# Patient Record
Sex: Female | Born: 1980 | Race: Black or African American | Hispanic: No | Marital: Married | Smoking: Former smoker
Health system: Southern US, Community
[De-identification: ages and names within clinical notes are randomized; demographics above are authoritative.]

## PROBLEM LIST (undated history)

## (undated) DIAGNOSIS — D219 Benign neoplasm of connective and other soft tissue, unspecified: Secondary | ICD-10-CM

## (undated) DIAGNOSIS — F419 Anxiety disorder, unspecified: Secondary | ICD-10-CM

## (undated) DIAGNOSIS — N946 Dysmenorrhea, unspecified: Secondary | ICD-10-CM

## (undated) DIAGNOSIS — R87610 Atypical squamous cells of undetermined significance on cytologic smear of cervix (ASC-US): Secondary | ICD-10-CM

## (undated) HISTORY — PX: DILATION AND CURETTAGE OF UTERUS: SHX78

## (undated) HISTORY — PX: KNEE ARTHROSCOPY W/ MENISCAL REPAIR: SHX1877

## (undated) HISTORY — DX: Anxiety disorder, unspecified: F41.9

## (undated) HISTORY — PX: HERNIA REPAIR: SHX51

## (undated) HISTORY — DX: Atypical squamous cells of undetermined significance on cytologic smear of cervix (ASC-US): R87.610

## (undated) HISTORY — DX: Dysmenorrhea, unspecified: N94.6

---

## 2004-08-14 ENCOUNTER — Ambulatory Visit: Payer: Self-pay | Admitting: General Surgery

## 2004-11-05 ENCOUNTER — Ambulatory Visit: Payer: Self-pay | Admitting: Gastroenterology

## 2005-12-22 ENCOUNTER — Emergency Department: Payer: Self-pay | Admitting: Emergency Medicine

## 2005-12-23 ENCOUNTER — Ambulatory Visit: Payer: Self-pay | Admitting: Emergency Medicine

## 2006-01-26 ENCOUNTER — Ambulatory Visit: Payer: Self-pay | Admitting: Obstetrics & Gynecology

## 2006-09-23 ENCOUNTER — Ambulatory Visit: Payer: Self-pay | Admitting: Obstetrics and Gynecology

## 2007-02-16 ENCOUNTER — Emergency Department: Payer: Self-pay | Admitting: Emergency Medicine

## 2007-08-30 ENCOUNTER — Emergency Department: Payer: Self-pay | Admitting: Emergency Medicine

## 2009-05-15 ENCOUNTER — Emergency Department: Payer: Self-pay | Admitting: Emergency Medicine

## 2009-07-19 ENCOUNTER — Ambulatory Visit: Payer: Self-pay | Admitting: General Practice

## 2009-08-16 ENCOUNTER — Ambulatory Visit: Payer: Self-pay | Admitting: General Practice

## 2010-12-09 ENCOUNTER — Emergency Department: Payer: Self-pay | Admitting: Emergency Medicine

## 2013-03-19 ENCOUNTER — Emergency Department: Payer: Self-pay | Admitting: Emergency Medicine

## 2013-11-14 ENCOUNTER — Ambulatory Visit: Payer: Self-pay | Admitting: General Practice

## 2013-12-12 ENCOUNTER — Ambulatory Visit: Payer: Self-pay | Admitting: General Practice

## 2014-01-11 ENCOUNTER — Ambulatory Visit: Payer: Self-pay | Admitting: General Practice

## 2014-11-18 ENCOUNTER — Encounter: Payer: Self-pay | Admitting: Emergency Medicine

## 2014-11-18 ENCOUNTER — Emergency Department
Admission: EM | Admit: 2014-11-18 | Discharge: 2014-11-18 | Disposition: A | Discharge status: Home / Self Care | Payer: BLUE CROSS/BLUE SHIELD | Attending: Emergency Medicine | Admitting: Emergency Medicine

## 2014-11-18 ENCOUNTER — Emergency Department: Payer: BLUE CROSS/BLUE SHIELD

## 2014-11-18 DIAGNOSIS — O200 Threatened abortion: Secondary | ICD-10-CM | POA: Diagnosis not present

## 2014-11-18 DIAGNOSIS — Z87891 Personal history of nicotine dependence: Secondary | ICD-10-CM | POA: Diagnosis not present

## 2014-11-18 DIAGNOSIS — Z3A01 Less than 8 weeks gestation of pregnancy: Secondary | ICD-10-CM | POA: Insufficient documentation

## 2014-11-18 DIAGNOSIS — O209 Hemorrhage in early pregnancy, unspecified: Secondary | ICD-10-CM | POA: Diagnosis present

## 2014-11-18 LAB — URINALYSIS COMPLETE WITH MICROSCOPIC (ARMC ONLY)
BACTERIA UA: NONE SEEN
Bilirubin Urine: NEGATIVE
Glucose, UA: NEGATIVE mg/dL
Ketones, ur: NEGATIVE mg/dL
Leukocytes, UA: NEGATIVE
NITRITE: NEGATIVE
PH: 7 (ref 5.0–8.0)
Protein, ur: 30 mg/dL — AB
Specific Gravity, Urine: 1.02 (ref 1.005–1.030)

## 2014-11-18 LAB — HCG, QUANTITATIVE, PREGNANCY: hCG, Beta Chain, Quant, S: 250 m[IU]/mL — ABNORMAL HIGH (ref <5)

## 2014-11-18 LAB — CBC
HCT: 40.5 % (ref 35.0–47.0)
Hemoglobin: 13.7 g/dL (ref 12.0–16.0)
MCH: 30 pg (ref 26.0–34.0)
MCHC: 33.9 g/dL (ref 32.0–36.0)
MCV: 88.6 fL (ref 80.0–100.0)
PLATELETS: 242 10*3/uL (ref 150–440)
RBC: 4.57 MIL/uL (ref 3.80–5.20)
RDW: 14.6 % — AB (ref 11.5–14.5)
WBC: 9.8 10*3/uL (ref 3.6–11.0)

## 2014-11-18 NOTE — ED Notes (Signed)
Patient to ED with report that she is about [redacted] weeks pregnant per 3 home pregnancy tests. Patient reports that for the last week she has significant bleeding and cramps along with passing some clots.

## 2014-11-18 NOTE — ED Provider Notes (Signed)
[facility] Emergency Department Provider Note  ____________________________________________  Time seen: 4:15 PM  I have reviewed the triage vital signs and the nursing notes.   HISTORY  Chief Complaint Threatened Miscarriage    HPI Alexis Cordova is a 34 y.o. female who complains of pelvic pain and cramping for the past week. She reports that she initially also had some vaginal spotting, which is now been worsening vaginal bleeding the last 5 days. She is having some clot passage as well. She denies any chest pain, shortness of breath, fever, chills, lightheadedness. She feels that she is about [redacted] weeks pregnant by LMP. 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 Denies any history of STI or PID. She has not had any prenatal care during this pregnancy yet, although she does have an upcoming appointment with Dr. Kenton Kingfisher with [facility] on Tuesday.She denies any dysuria or hematuria.  The pelvic pain is described as cramping, nonradiating, no alleviating or aggravating factors, intermittent, moderate intensity. Other than some occasional nausea, no associated symptoms.    History reviewed. No pertinent past medical history.  There are no active problems to display for this patient.   History reviewed. No pertinent past surgical history. Prior umbilical hernia repair No current outpatient prescriptions on file.  Allergies Shellfish allergy  History reviewed. No pertinent family history.  Social History History  Substance Use Topics  . Smoking status: Former Research scientist (life sciences)  . Smokeless tobacco: Never Used  . Alcohol Use: No    Review of Systems  Constitutional: No fever or chills. No weight changes Eyes:No blurry vision or double vision.  ENT: No sore throat. Cardiovascular: No chest pain. Respiratory: No dyspnea or cough. Gastrointestinal: No vomiting and diarrhea.  No BRBPR or melena. Genitourinary: Negative for dysuria, urinary  retention, bloody urine, or difficulty urinating. Musculoskeletal: Negative for back pain. No joint swelling or pain. Skin: Negative for rash. Neurological: Negative for headaches, focal weakness or numbness. Psychiatric:No anxiety or depression.   Endocrine:No hot/cold intolerance, changes in energy, or sleep difficulty.  10-point ROS otherwise negative.  ____________________________________________   PHYSICAL EXAM:  VITAL SIGNS: ED Triage Vitals  Enc Vitals Group     BP 11/18/14 1414 158/93 mmHg     Pulse Rate 11/18/14 1414 115     Resp 11/18/14 1414 20     Temp 11/18/14 1414 98.5 F (36.9 C)     Temp Source 11/18/14 1414 Oral     SpO2 11/18/14 1414 99 %     Weight 11/18/14 1414 180 lb (81.647 kg)     Height 11/18/14 1414 5\' 1"  (1.549 m)     Head Cir --      Peak Flow --      Pain Score 11/18/14 1414 7     Pain Loc --      Pain Edu? --      Excl. in [place]? --      Constitutional: Alert and oriented. Well appearing and in no distress. Eyes: No scleral icterus. No conjunctival pallor. PERRL. EOMI ENT   Head: Normocephalic and atraumatic.   Nose: No congestion/rhinnorhea. No septal hematoma   Mouth/Throat: MMM, no pharyngeal erythema   Neck: No stridor. No SubQ emphysema.  Hematological/Lymphatic/Immunilogical: No cervical lymphadenopathy. Cardiovascular: RRR. Normal and symmetric distal pulses are present in all extremities. No murmurs, rubs, or gallops. Respiratory: Normal respiratory effort without tachypnea nor retractions. Breath sounds are clear and equal bilaterally. No wheezes/rales/rhonchi. Gastrointestinal: Soft, mild suprapubic tenderness.. No distention. There  is no CVA tenderness.  No rebound, rigidity, or guarding. Genitourinary: deferred Musculoskeletal: Nontender with normal range of motion in all extremities. No joint effusions.  No lower extremity tenderness.  No edema. Neurologic:   Normal speech and language.  CN 2-10 normal. Motor  grossly intact. No pronator drift.  Normal gait. No gross focal neurologic deficits are appreciated.  Skin:  Skin is warm, dry and intact. No rash noted.  No petechiae, purpura, or bullae. Psychiatric: Mood and affect are normal. Speech and behavior are normal. Patient exhibits appropriate insight and judgment.  ____________________________________________    LABS (pertinent positives/negatives) Labs Reviewed  CBC - Abnormal; Notable for the following:    RDW 14.6 (*)    All other components within normal limits  HCG, QUANTITATIVE, PREGNANCY - Abnormal; Notable for the following:    hCG, Beta Chain, Quant, S 250 (*)    All other components within normal limits  URINALYSIS COMPLETEWITH MICROSCOPIC ([facility])  - Abnormal; Notable for the following:    Color, Urine YELLOW (*)    APPearance HAZY (*)    Hgb urine dipstick 3+ (*)    Protein, ur 30 (*)    Squamous Epithelial / LPF 0-5 (*)    All other components within normal limits  TYPE AND SCREEN  ABO/RH   ____________________________________________   EKG    ____________________________________________    RADIOLOGY  Pelvic ultrasound reveals an empty uterus and no visualized intra-or extrauterine pregnancy. There is a probable small leiomyoma.  ____________________________________________   PROCEDURES  ____________________________________________   INITIAL IMPRESSION / ASSESSMENT AND PLAN / ED COURSE  Pertinent labs & imaging results that were available during my care of the patient were reviewed by me and considered in my medical decision making (see chart for details).  The patient presents with a threatened miscarriage or possible ectopic pregnancy. She is currently hemodynamically stable, but with one week of worsening vaginal bleeding and tachycardia on presentation to the triage desk, we'll proceed with an ultrasound to evaluate for ectopic pregnancy and further characterize whether she has an IUP or if she is  possibly already had a miscarriage. The patient declines any pain medicine or antiemetics at this time. ----------------------------------------- 7:16 PM on 11/18/2014 -----------------------------------------  Ultrasound result obtained. Vital signs normal in the ED now. No clear ectopic or hemoperitoneum. The patient artery has an appointment with her obstetrician in 3 days, so she'll follow up for repeat blood testing at that time to monitor her hCG. Return precautions given patient understands. Medically stable at this time, to be discharged home. ____________________________________________   FINAL CLINICAL IMPRESSION(S) / ED DIAGNOSES  Final diagnoses:  Threatened miscarriage in early pregnancy      Carrie Mew, MD 11/18/14 [phone]

## 2014-11-18 NOTE — ED Notes (Signed)
Pt resting in bed, husband at bedside. 

## 2014-11-18 NOTE — ED Notes (Signed)
Patient transported to Ultrasound 

## 2014-11-18 NOTE — Discharge Instructions (Signed)
Vaginal Bleeding During Pregnancy, First Trimester A small amount of bleeding (spotting) from the vagina is relatively common in early pregnancy. It usually stops on its own. Various things may cause bleeding or spotting in early pregnancy. Some bleeding may be related to the pregnancy, and some may not. In most cases, the bleeding is normal and is not a problem. However, bleeding can also be a sign of something serious. Be sure to tell your health care provider about any vaginal bleeding right away. Some possible causes of vaginal bleeding during the first trimester include:  Infection or inflammation of the cervix.  Growths (polyps) on the cervix.  Miscarriage or threatened miscarriage.  Pregnancy tissue has developed outside of the uterus and in a fallopian tube (tubal pregnancy).  Tiny cysts have developed in the uterus instead of pregnancy tissue (molar pregnancy). HOME CARE INSTRUCTIONS  Watch your condition for any changes. The following actions may help to lessen any discomfort you are feeling:  Follow your health care provider's instructions for limiting your activity. If your health care provider orders bed rest, you may need to stay in bed and only get up to use the bathroom. However, your health care provider may allow you to continue light activity.  If needed, make plans for someone to help with your regular activities and responsibilities while you are on bed rest.  Keep track of the number of pads you use each day, how often you change pads, and how soaked (saturated) they are. Write this down.  Do not use tampons. Do not douche.  Do not have sexual intercourse or orgasms until approved by your health care provider.  If you pass any tissue from your vagina, save the tissue so you can show it to your health care provider.  Only take over-the-counter or prescription medicines as directed by your health care provider.  Do not take aspirin because it can make you  bleed.  Keep all follow-up appointments as directed by your health care provider. SEEK MEDICAL CARE IF:  You have any vaginal bleeding during any part of your pregnancy.  You have cramps or labor pains.  You have a fever, not controlled by medicine. SEEK IMMEDIATE MEDICAL CARE IF:   You have severe cramps in your back or belly (abdomen).  You pass large clots or tissue from your vagina.  Your bleeding increases.  You feel light-headed or weak, or you have fainting episodes.  You have chills.  You are leaking fluid or have a gush of fluid from your vagina.  You pass out while having a bowel movement. MAKE SURE YOU:  Understand these instructions.  Will watch your condition.  Will get help right away if you are not doing well or get worse. Document Released: 04/09/2005 Document Revised: 07/05/2013 Document Reviewed: 03/07/2013 [facility] Patient Information 2015 [place], [place]. This information is not intended to replace advice given to you by your health care provider. Make sure you discuss any questions you have with your health care provider.  Your ultrasound was unable to find a pregnancy in the uterus or anywhere else at this time. This may be because it is too early, because you have already had a miscarriage, or because there is an ectopic (abnormally located) pregnancy. Follow-up with Dr. Kenton Kingfisher at your appointment in 3 days for repeat blood testing.

## 2014-11-19 LAB — TYPE AND SCREEN
ABO/RH(D): O POS
Antibody Screen: NEGATIVE

## 2014-11-19 LAB — ABO/RH: ABO/RH(D): O POS

## 2014-12-08 ENCOUNTER — Other Ambulatory Visit
Admission: RE | Admit: 2014-12-08 | Discharge: 2014-12-08 | Disposition: A | Discharge status: Home / Self Care | Payer: BLUE CROSS/BLUE SHIELD | Source: Ambulatory Visit | Attending: Obstetrics & Gynecology | Admitting: Obstetrics & Gynecology

## 2014-12-08 DIAGNOSIS — O009 Ectopic pregnancy, unspecified: Secondary | ICD-10-CM | POA: Insufficient documentation

## 2014-12-08 LAB — HCG, QUANTITATIVE, PREGNANCY: hCG, Beta Chain, Quant, S: 610 m[IU]/mL — ABNORMAL HIGH (ref <5)

## 2014-12-11 ENCOUNTER — Other Ambulatory Visit
Admission: RE | Admit: 2014-12-11 | Discharge: 2014-12-11 | Disposition: A | Discharge status: Home / Self Care | Payer: BLUE CROSS/BLUE SHIELD | Source: Ambulatory Visit | Attending: Obstetrics & Gynecology | Admitting: Obstetrics & Gynecology

## 2014-12-11 DIAGNOSIS — O009 Ectopic pregnancy, unspecified: Secondary | ICD-10-CM | POA: Insufficient documentation

## 2014-12-11 LAB — HCG, QUANTITATIVE, PREGNANCY: HCG, BETA CHAIN, QUANT, S: 437 m[IU]/mL — AB (ref <5)

## 2015-11-01 DIAGNOSIS — T781XXD Other adverse food reactions, not elsewhere classified, subsequent encounter: Secondary | ICD-10-CM | POA: Diagnosis not present

## 2016-09-02 DIAGNOSIS — Z01419 Encounter for gynecological examination (general) (routine) without abnormal findings: Secondary | ICD-10-CM | POA: Diagnosis not present

## 2016-12-04 DIAGNOSIS — H52213 Irregular astigmatism, bilateral: Secondary | ICD-10-CM | POA: Diagnosis not present

## 2017-01-21 ENCOUNTER — Telehealth: Payer: Self-pay

## 2017-01-21 NOTE — Telephone Encounter (Signed)
Do you want her to come in  or go to ER?

## 2017-01-21 NOTE — Telephone Encounter (Signed)
Appt Thurs

## 2017-01-21 NOTE — Telephone Encounter (Signed)
Pt thinks she has had another miscarriage.  Wants to know if it's best if she come or wait?  Please call.  [phone]

## 2017-01-22 ENCOUNTER — Encounter: Payer: Self-pay | Admitting: Obstetrics & Gynecology

## 2017-01-27 ENCOUNTER — Ambulatory Visit (INDEPENDENT_AMBULATORY_CARE_PROVIDER_SITE_OTHER): Payer: BLUE CROSS/BLUE SHIELD | Admitting: Obstetrics & Gynecology

## 2017-01-27 ENCOUNTER — Encounter: Payer: Self-pay | Admitting: Obstetrics & Gynecology

## 2017-01-27 VITALS — BP 120/80 | Ht 61.0 in | Wt 173.0 lb

## 2017-01-27 DIAGNOSIS — O209 Hemorrhage in early pregnancy, unspecified: Secondary | ICD-10-CM | POA: Insufficient documentation

## 2017-01-27 NOTE — Progress Notes (Signed)
Obstetric Problem Visit   Chief Complaint:  Chief Complaint  Patient presents with  . Threatened Miscarriage    History of Present Illness: Patient is a 36 y.o. [id number] w LMP 12/12/16 for first trimester bleeding.  The onset of bleeding was last week for 4 days now gone.  Had breast T now resolved.  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    Is bleeding equal to or greater than normal menstrual flow:  No Any recent trauma:  No Recent intercourse:  No History of prior miscarriage:  Yes Prior ultrasound demonstrating IUP:  No Prior ultrasound demonstrating viable IUP:  No Prior Serum HCG:  No Rh status: O+  PMHx: She  has no past medical history on file. Also,  has no past surgical history on file., family history is not on file.,  reports that she has quit smoking. She has never used smokeless tobacco. She reports that she does not drink alcohol or use drugs.  She currently has no medications in their medication list. Also, is allergic to shellfish allergy.  Review of Systems  Constitutional: Negative for chills, fever and malaise/fatigue.  HENT: Negative for congestion, sinus pain and sore throat.   Eyes: Negative for blurred vision and pain.  Respiratory: Negative for cough and wheezing.   Cardiovascular: Negative for chest pain and leg swelling.  Gastrointestinal: Negative for abdominal pain, constipation, diarrhea, heartburn, nausea and vomiting.  Genitourinary: Negative for dysuria, frequency, hematuria and urgency.  Musculoskeletal: Negative for back pain, joint pain, myalgias and neck pain.  Skin: Negative for itching and rash.  Neurological: Negative for dizziness, tremors and weakness.  Endo/Heme/Allergies: Does not bruise/bleed easily.  Psychiatric/Behavioral: Negative for depression. The patient is not nervous/anxious and does not have insomnia.     Objective: Vitals:   01/27/17 0900  BP:  120/80   Physical Exam  Constitutional: She is oriented to person, place, and time. She appears well-developed and well-nourished. No distress.  Musculoskeletal: Normal range of motion.  Neurological: She is alert and oriented to person, place, and time.  Skin: Skin is warm and dry.  Psychiatric: She has a normal mood and affect.  Vitals reviewed.   Assessment: 36 y.o. [id number] Unknown 1. First trimester bleeding - Beta HCG, Quant   Plan: Problem List Items Addressed This Visit      Other   First trimester bleeding - Primary   Relevant Orders   Beta HCG, Quant     1) First trimester bleeding - incidence and clinical course of first trimester bleeding is discussed in detail with the patient today.  Approximately 1/3 of pregnancies ending in live births experienced 1st trimester bleeding.  The amount of bleeding is variable and not necessarily predictive of outcome.  Sources may be cervical or uterine.  Subchorionic hemorrhages are a frequent concurrent findings on ultrasound and are followed expectantly.  These often absorb or regress spontaneously although risk for expansion and further disruption of the utero-placental interface leading to miscarriage is possible.  There is no clearly documented benefit to limiting or modifying activity and sexual intercourse in altering clinic course of 1st trimester bleeding.    2) Trend HCG levels.  This follows similar miscarriage pattern for pt.  3) The patient is Rh +, rhogam is therefore not indicated to decrease the risk rhesus alloimmunization.    4) Routine bleeding precautions were discussed with the patient prior the conclusion of today's  visit.  5) Rec REI referral for optimal conception planning as she needs support due to recurrent history.  Barnett Applebaum, MD, [facility], [facility] 01/27/2017  9:19 AM

## 2017-01-28 LAB — BETA HCG QUANT (REF LAB): hCG Quant: 4 m[IU]/mL

## 2017-10-15 ENCOUNTER — Ambulatory Visit: Payer: BLUE CROSS/BLUE SHIELD | Admitting: Obstetrics & Gynecology

## 2017-11-09 ENCOUNTER — Ambulatory Visit (INDEPENDENT_AMBULATORY_CARE_PROVIDER_SITE_OTHER): Payer: BLUE CROSS/BLUE SHIELD | Admitting: Obstetrics & Gynecology

## 2017-11-09 ENCOUNTER — Encounter: Payer: Self-pay | Admitting: Obstetrics & Gynecology

## 2017-11-09 VITALS — BP 130/80 | Ht 61.0 in | Wt 177.0 lb

## 2017-11-09 DIAGNOSIS — Z0000 Encounter for general adult medical examination without abnormal findings: Secondary | ICD-10-CM

## 2017-11-09 DIAGNOSIS — E669 Obesity, unspecified: Secondary | ICD-10-CM | POA: Insufficient documentation

## 2017-11-09 NOTE — Progress Notes (Signed)
HPI:      Alexis Cordova is a 37 y.o. [id number] who LMP was Patient's last menstrual period was 11/03/2017., she presents today for her annual examination. The patient has no complaints today. The patient is sexually active. Her last pap: approximate date 2018 and was normal. The patient does perform self breast exams.  There is notable family history of breast or ovarian cancer in her family.  The patient has regular exercise: yes.  The patient denies current symptoms of depression.    GYN History: Contraception: none  PMHx: Past Medical History:  Diagnosis Date  . Dysmenorrhea   . Pap smear abnormality of cervix with ASCUS favoring benign    Past Surgical History:  Procedure Laterality Date  . HERNIA REPAIR     History reviewed. No pertinent family history. Social History   Tobacco Use  . Smoking status: Former Research scientist (life sciences)  . Smokeless tobacco: Never Used  Substance Use Topics  . Alcohol use: No  . Drug use: No   No current outpatient medications on file. Allergies: Shellfish allergy  Review of Systems  Constitutional: Negative for chills, fever and malaise/fatigue.  HENT: Negative for congestion, sinus pain and sore throat.   Eyes: Negative for blurred vision and pain.  Respiratory: Negative for cough and wheezing.   Cardiovascular: Negative for chest pain and leg swelling.  Gastrointestinal: Negative for abdominal pain, constipation, diarrhea, heartburn, nausea and vomiting.  Genitourinary: Negative for dysuria, frequency, hematuria and urgency.  Musculoskeletal: Negative for back pain, joint pain, myalgias and neck pain.  Skin: Negative for itching and rash.  Neurological: Negative for dizziness, tremors and weakness.  Endo/Heme/Allergies: Does not bruise/bleed easily.  Psychiatric/Behavioral: Negative for depression. The patient is not nervous/anxious and does not have insomnia.     Objective: BP 130/80   Ht 5\' 1"  (1.549 m)   Wt 177 lb (80.3 kg)   LMP  11/03/2017   BMI 33.44 kg/m   Filed Weights   11/09/17 1427  Weight: 177 lb (80.3 kg)   Body mass index is 33.44 kg/m. Physical Exam  Constitutional: She is oriented to person, place, and time. She appears well-developed and well-nourished. No distress.  Genitourinary: Rectum normal, vagina normal and uterus normal. Pelvic exam was performed with patient supine. There is no rash or lesion on the right labia. There is no rash or lesion on the left labia. Vagina exhibits no lesion. No bleeding in the vagina. Right adnexum does not display mass and does not display tenderness. Left adnexum does not display mass and does not display tenderness. Cervix does not exhibit motion tenderness, lesion, friability or polyp.   Uterus is mobile and midaxial. Uterus is not enlarged or exhibiting a mass.  HENT:  Head: Normocephalic and atraumatic. Head is without laceration.  Right Ear: Hearing normal.  Left Ear: Hearing normal.  Nose: No epistaxis.  No foreign bodies.  Mouth/Throat: Uvula is midline, oropharynx is clear and moist and mucous membranes are normal.  Eyes: Pupils are equal, round, and reactive to light.  Neck: Normal range of motion. Neck supple. No thyromegaly present.  Cardiovascular: Normal rate and regular rhythm. Exam reveals no gallop and no friction rub.  No murmur heard. Pulmonary/Chest: Effort normal and breath sounds normal. No respiratory distress. She has no wheezes. Right breast exhibits no mass, no skin change and no tenderness. Left breast exhibits no mass, no skin change and no tenderness.  Abdominal: Soft. Bowel sounds are normal. She exhibits no distension. There is no  tenderness. There is no rebound.  Musculoskeletal: Normal range of motion.  Neurological: She is alert and oriented to person, place, and time. No cranial nerve deficit.  Skin: Skin is warm and dry.  Psychiatric: She has a normal mood and affect. Judgment normal.  Vitals reviewed.  Assessment:  ANNUAL  EXAM 1. Annual physical exam   2. Obesity (BMI 30-39.9)    Screening Plan:            1.  Cervical Screening-  Pap smear schedule reviewed with patient  2. Breast screening- Exam annually and mammogram>40 planned. Genetic testing discussed as option.  3. Colonoscopy every 10 years, Hemoccult testing - after age 1  4. Labs managed by PCP  5. Counseling for contraception: no method, h/o recurrent preg loss; considering REI referral  6. Obesity (BMI 30-39.9) Weight loss meds in past no help Encouraged diet, exercise    F/U  Return in about 1 year (around 11/10/2018) for Annual.  Barnett Applebaum, MD, [facility], [facility] 11/09/2017  3:00 PM

## 2018-02-25 ENCOUNTER — Ambulatory Visit (INDEPENDENT_AMBULATORY_CARE_PROVIDER_SITE_OTHER): Payer: BLUE CROSS/BLUE SHIELD | Admitting: Obstetrics and Gynecology

## 2018-02-25 ENCOUNTER — Other Ambulatory Visit (HOSPITAL_COMMUNITY)
Admission: RE | Admit: 2018-02-25 | Discharge: 2018-02-25 | Disposition: A | Discharge status: Home / Self Care | Payer: BLUE CROSS/BLUE SHIELD | Source: Ambulatory Visit | Attending: Obstetrics and Gynecology | Admitting: Obstetrics and Gynecology

## 2018-02-25 ENCOUNTER — Encounter: Payer: Self-pay | Admitting: Obstetrics and Gynecology

## 2018-02-25 VITALS — BP 128/76 | HR 87 | Wt 181.0 lb

## 2018-02-25 DIAGNOSIS — Z3A11 11 weeks gestation of pregnancy: Secondary | ICD-10-CM

## 2018-02-25 DIAGNOSIS — O418X10 Other specified disorders of amniotic fluid and membranes, first trimester, not applicable or unspecified: Secondary | ICD-10-CM

## 2018-02-25 DIAGNOSIS — Z3481 Encounter for supervision of other normal pregnancy, first trimester: Secondary | ICD-10-CM | POA: Diagnosis not present

## 2018-02-25 DIAGNOSIS — Z113 Encounter for screening for infections with a predominantly sexual mode of transmission: Secondary | ICD-10-CM | POA: Diagnosis not present

## 2018-02-25 DIAGNOSIS — IMO0001 Reserved for inherently not codable concepts without codable children: Secondary | ICD-10-CM

## 2018-02-25 DIAGNOSIS — O09291 Supervision of pregnancy with other poor reproductive or obstetric history, first trimester: Secondary | ICD-10-CM

## 2018-02-25 DIAGNOSIS — Z130 Encounter for screening for diseases of the blood and blood-forming organs and certain disorders involving the immune mechanism: Secondary | ICD-10-CM

## 2018-02-25 DIAGNOSIS — O418X20 Other specified disorders of amniotic fluid and membranes, second trimester, not applicable or unspecified: Secondary | ICD-10-CM

## 2018-02-25 DIAGNOSIS — O468X1 Other antepartum hemorrhage, first trimester: Secondary | ICD-10-CM

## 2018-02-25 DIAGNOSIS — O468X2 Other antepartum hemorrhage, second trimester: Secondary | ICD-10-CM

## 2018-02-25 NOTE — Progress Notes (Signed)
NOB Spotting only toilet tissue/no cramping

## 2018-02-25 NOTE — Progress Notes (Signed)
02/25/2018   Chief Complaint: Missed period  Transfer of Care Patient: no  History of Present Illness: Alexis Cordova is a 37 y.o. G7P0060 [redacted]w[redacted]d based on Patient's last menstrual period was 12/07/2017 (exact date). with an Estimated Date of Delivery: 09/13/18, with the above CC.   Her periods were: regular periods every 30 days She was using no method when she conceived.  She has Negative signs or symptoms of nausea/vomiting of pregnancy. She has Negative signs or symptoms of miscarriage or preterm labor She identifies Negative Zika risk factors for her and her partner On any different medications around the time she conceived/early pregnancy: No  History of varicella: Yes   ROS: A 12-point review of systems was performed and negative, except as stated in the above HPI.  OBGYN History: As per HPI. OB History  Gravida Para Term Preterm AB Living  7 0 0 0 6 0  SAB TAB Ectopic Multiple Live Births  5 1          # Outcome Date GA Lbr Len/2nd Weight Sex Delivery Anes PTL Lv  7 Current           6 TAB           5 SAB           4 SAB           3 SAB           2 SAB           1 SAB             Any issues with any prior pregnancies: yes, multiple miscarriages (5) Any prior children are healthy, doing well, without any problems or issues: not applicable History of pap smears: Yes. Last pap smear 2018. Abnormal: no   History of STIs: No   Past Medical History: Past Medical History:  Diagnosis Date  . Anxiety   . Dysmenorrhea   . Pap smear abnormality of cervix with ASCUS favoring benign     Past Surgical History: Past Surgical History:  Procedure Laterality Date  . DILATION AND CURETTAGE OF UTERUS    . HERNIA REPAIR     Umbilical hernia    Family History:  History reviewed. No pertinent family history. She denies any female cancers, bleeding or blood clotting disorders.  She denies any history of mental retardation, birth defects or genetic disorders in her or the FOB's  history  Social History:  Social History   Socioeconomic History  . Marital status: Married    Spouse name: Not on file  . Number of children: Not on file  . Years of education: Not on file  . Highest education level: Not on file  Occupational History  . Not on file  Social Needs  . Financial resource strain: Not on file  . Food insecurity:    Worry: Not on file    Inability: Not on file  . Transportation needs:    Medical: Not on file    Non-medical: Not on file  Tobacco Use  . Smoking status: Former Research scientist (life sciences)  . Smokeless tobacco: Never Used  Substance and Sexual Activity  . Alcohol use: No  . Drug use: No  . Sexual activity: Yes    Birth control/protection: None  Lifestyle  . Physical activity:    Days per week: Not on file    Minutes per session: Not on file  . Stress: Not on file  Relationships  . Social connections:  Talks on phone: Not on file    Gets together: Not on file    Attends religious service: Not on file    Active member of club or organization: Not on file    Attends meetings of clubs or organizations: Not on file    Relationship status: Not on file  . Intimate partner violence:    Fear of current or ex partner: Not on file    Emotionally abused: Not on file    Physically abused: Not on file    Forced sexual activity: Not on file  Other Topics Concern  . Not on file  Social History Narrative  . Not on file   Any pets in the household: yes Dogs  Allergy: Allergies  Allergen Reactions  . Shellfish Allergy     Just sick     Current Outpatient Medications: No current outpatient medications on file.   Physical Exam:   BP 128/76   Pulse 87   Wt 181 lb (82.1 kg)   LMP 12/07/2017 (Exact Date)   BMI 34.20 kg/m  Body mass index is 34.2 kg/m. Constitutional: Well nourished, well developed female in no acute distress.  Neck:  Supple, normal appearance, and no thyromegaly  Cardiovascular: S1, S2 normal, no murmur, rub or gallop,  regular rate and rhythm Respiratory:  Clear to auscultation bilateral. Normal respiratory effort Abdomen: positive bowel sounds and no masses, hernias; diffusely non tender to palpation, non distended Breasts: breasts appear normal, no suspicious masses, no skin or nipple changes or axillary nodes. Neuro/Psych:  Normal mood and affect.  Skin:  Warm and dry.  Lymphatic:  No inguinal lymphadenopathy.   Pelvic exam: is limited by body habitus EGBUS: within normal limits, Vagina: within normal limits and with no blood in the vault, Cervix: normal appearing cervix without discharge or lesions, closed/long/high, Uterus:  enlarged: 12-14 week size, and Adnexa:  normal adnexa  Bedside US showed a viable infant with FHR 170 bpm. Appeared to be 12-[redacted] weeks gestation but grown rump length was not able to be performed because of fetal position.   Assessment: Alexis Cordova is a 37 y.o. G7P0060 [redacted]w[redacted]d based on Patient's last menstrual period was 12/07/2017 (exact date). with an Estimated Date of Delivery: 09/13/18,  for prenatal care.  Plan:  1) Avoid alcoholic beverages. 2) Patient encouraged not to smoke.  3) Discontinue the use of all non-medicinal drugs and chemicals.  4) Take prenatal vitamins daily.  5) Seatbelt use advised 6) Nutrition, food safety (fish, cheese advisories, and high nitrite foods) and exercise discussed. 7) Hospital and practice style delivering at [facility] discussed  8) Patient is asked about travel to areas at risk for the [place] virus, and counseled to avoid travel and exposure to mosquitoes or sexual partners who may have themselves been exposed to the virus. Testing is discussed, and will be ordered as appropriate.  9) Childbirth classes at [facility] advised 10) Genetic Screening, such as with 1st Trimester Screening, cell free fetal DNA, AFP testing, and Ultrasound, as well as with amniocentesis and [facility] as appropriate, is discussed with patient. She plans to have genetic testing this  pregnancy.  Problem list reviewed and updated.  Adrian Prows MD [facility], [facility] 02/25/18 5:12 PM

## 2018-02-26 ENCOUNTER — Telehealth: Payer: Self-pay

## 2018-02-26 ENCOUNTER — Ambulatory Visit (INDEPENDENT_AMBULATORY_CARE_PROVIDER_SITE_OTHER): Payer: BLUE CROSS/BLUE SHIELD | Admitting: Obstetrics and Gynecology

## 2018-02-26 VITALS — BP 130/80 | Wt 181.0 lb

## 2018-02-26 DIAGNOSIS — O200 Threatened abortion: Secondary | ICD-10-CM

## 2018-02-26 LAB — RPR+RH+ABO+RUB AB+AB SCR+CB...
Antibody Screen: NEGATIVE
HIV Screen 4th Generation wRfx: NONREACTIVE
Hematocrit: 39.6 % (ref 34.0–46.6)
Hemoglobin: 13.8 g/dL (ref 11.1–15.9)
Hepatitis B Surface Ag: NEGATIVE
MCH: 29.9 pg (ref 26.6–33.0)
MCHC: 34.8 g/dL (ref 31.5–35.7)
MCV: 86 fL (ref 79–97)
PLATELETS: 285 10*3/uL (ref 150–450)
RBC: 4.61 x10E6/uL (ref 3.77–5.28)
RDW: 15.2 % (ref 12.3–15.4)
RPR Ser Ql: NONREACTIVE
Rh Factor: POSITIVE
Rubella Antibodies, IGG: 2.79 index (ref >0.99)
Varicella zoster IgG: 1903 index (ref >165)
WBC: 8.5 10*3/uL (ref 3.4–10.8)

## 2018-02-26 LAB — URINE DRUG PANEL 7
AMPHETAMINES, URINE: NEGATIVE ng/mL
BARBITURATE QUANT UR: NEGATIVE ng/mL
BENZODIAZEPINE QUANT UR: NEGATIVE ng/mL
CANNABINOID QUANT UR: NEGATIVE ng/mL
Cocaine (Metab.): NEGATIVE ng/mL
Opiate Quant, Ur: NEGATIVE ng/mL
PCP QUANT UR: NEGATIVE ng/mL

## 2018-02-26 LAB — CERVICOVAGINAL ANCILLARY ONLY
Chlamydia: NEGATIVE
Neisseria Gonorrhea: NEGATIVE
Trichomonas: NEGATIVE

## 2018-02-26 NOTE — Progress Notes (Signed)
C/o Vaginal bleeding starting last night at 9pm, no pain, many clots.

## 2018-02-26 NOTE — Telephone Encounter (Addendum)
Pt reported to after hours triage she was just seen for NOB & is bleeding passing lots of tissue, but not in pain. She is [redacted] wks pregnant. Triage advised her to contact PCP within 24 hours.

## 2018-02-26 NOTE — Telephone Encounter (Signed)
Well double book her and let her come in

## 2018-02-27 LAB — URINE CULTURE

## 2018-03-05 ENCOUNTER — Ambulatory Visit (INDEPENDENT_AMBULATORY_CARE_PROVIDER_SITE_OTHER): Payer: BLUE CROSS/BLUE SHIELD | Admitting: Obstetrics & Gynecology

## 2018-03-05 ENCOUNTER — Ambulatory Visit (INDEPENDENT_AMBULATORY_CARE_PROVIDER_SITE_OTHER): Payer: BLUE CROSS/BLUE SHIELD

## 2018-03-05 VITALS — BP 120/80 | Wt 181.0 lb

## 2018-03-05 DIAGNOSIS — Z3481 Encounter for supervision of other normal pregnancy, first trimester: Secondary | ICD-10-CM

## 2018-03-05 DIAGNOSIS — Z3A12 12 weeks gestation of pregnancy: Secondary | ICD-10-CM

## 2018-03-05 DIAGNOSIS — O09522 Supervision of elderly multigravida, second trimester: Secondary | ICD-10-CM | POA: Diagnosis not present

## 2018-03-05 DIAGNOSIS — D252 Subserosal leiomyoma of uterus: Secondary | ICD-10-CM

## 2018-03-05 DIAGNOSIS — O3413 Maternal care for benign tumor of corpus uteri, third trimester: Secondary | ICD-10-CM

## 2018-03-05 DIAGNOSIS — D251 Intramural leiomyoma of uterus: Secondary | ICD-10-CM

## 2018-03-05 DIAGNOSIS — D219 Benign neoplasm of connective and other soft tissue, unspecified: Secondary | ICD-10-CM

## 2018-03-05 DIAGNOSIS — O3411 Maternal care for benign tumor of corpus uteri, first trimester: Secondary | ICD-10-CM

## 2018-03-05 LAB — POCT URINALYSIS DIPSTICK OB
Glucose, UA: NEGATIVE — AB
POC,PROTEIN,UA: NEGATIVE

## 2018-03-05 NOTE — Progress Notes (Signed)
  HPI: Pt has h/o recurrent preg loss, now pregnant [redacted] weeks.  Post coital spotting, so has stopped having sex.  No pain.  Ultrasound demonstrates 4 fibroids seen, one posterior intramural CRL c/w 12 weeks pregnancy, good FHT  PMHx: She  has a past medical history of Anxiety, Dysmenorrhea, and Pap smear abnormality of cervix with ASCUS favoring benign. Also,  has a past surgical history that includes Hernia repair and Dilation and curettage of uterus., family history is not on file.,  reports that she has quit smoking. She has never used smokeless tobacco. She reports that she does not drink alcohol or use drugs.  She currently has no medications in their medication list. Also, is allergic to shellfish allergy.  ROS- all neg except as above  Objective: BP 120/80   Wt 181 lb (82.1 kg)   LMP 12/07/2017 (Exact Date)   BMI 34.20 kg/m   Physical examination Constitutional NAD, Conversant  Skin No rashes, lesions or ulceration.   Extremities: Moves all appropriately.  Normal ROM for age. No lymphadenopathy.  Neuro: Grossly intact  Psych: Oriented to PPT.  Normal mood. Normal affect.   Assessment:  [redacted] weeks gestation of pregnancy - Plan: Hemoglobinopathy evaluation, Genetic labs  Fibroids - Plan: AMB referral to maternal fetal medicine    Counseled as to risks on pregnancy, miscarriage and PTL risks discussed    Baby ASA advised    Cont PNV  Multigravida of advanced maternal age in second trimester - Plan: MaterniT21 PLUS Core+SCA    Risks of aneuploidy discussed  Barnett Applebaum, MD, [facility], [facility] 03/05/2018  9:22 AM

## 2018-03-05 NOTE — Addendum Note (Signed)
Addended by: Quintella Baton D on: 03/05/2018 09:30 AM   Modules accepted: Orders

## 2018-03-05 NOTE — Patient Instructions (Signed)

## 2018-03-08 LAB — HEMOGLOBINOPATHY EVALUATION
HGB C: 0 %
HGB S: 0 %
HGB VARIANT: 0 %
Hemoglobin A2 Quantitation: 2.4 % (ref 1.8–3.2)
Hemoglobin F Quantitation: 0 % (ref 0.0–2.0)
Hgb A: 97.6 % (ref 96.4–98.8)

## 2018-03-11 LAB — MATERNIT21 PLUS CORE+SCA
Chromosome 13: NEGATIVE
Chromosome 18: NEGATIVE
Chromosome 21: NEGATIVE
Y Chromosome: DETECTED

## 2018-03-18 ENCOUNTER — Ambulatory Visit (HOSPITAL_BASED_OUTPATIENT_CLINIC_OR_DEPARTMENT_OTHER)
Admission: RE | Admit: 2018-03-18 | Discharge: 2018-03-18 | Disposition: A | Discharge status: Home / Self Care | Payer: BLUE CROSS/BLUE SHIELD | Source: Ambulatory Visit | Attending: Maternal & Fetal Medicine | Admitting: Maternal & Fetal Medicine

## 2018-03-18 ENCOUNTER — Other Ambulatory Visit: Payer: Self-pay | Admitting: Maternal & Fetal Medicine

## 2018-03-18 ENCOUNTER — Ambulatory Visit
Admission: RE | Admit: 2018-03-18 | Discharge: 2018-03-18 | Disposition: A | Discharge status: Home / Self Care | Payer: BLUE CROSS/BLUE SHIELD | Source: Ambulatory Visit | Attending: Maternal & Fetal Medicine | Admitting: Maternal & Fetal Medicine

## 2018-03-18 VITALS — BP 123/83 | HR 85 | Temp 99.2°F | Resp 18 | Wt 179.0 lb

## 2018-03-18 DIAGNOSIS — O99212 Obesity complicating pregnancy, second trimester: Secondary | ICD-10-CM | POA: Insufficient documentation

## 2018-03-18 DIAGNOSIS — E669 Obesity, unspecified: Secondary | ICD-10-CM | POA: Diagnosis not present

## 2018-03-18 DIAGNOSIS — Z3A00 Weeks of gestation of pregnancy not specified: Secondary | ICD-10-CM | POA: Diagnosis not present

## 2018-03-18 DIAGNOSIS — O09522 Supervision of elderly multigravida, second trimester: Secondary | ICD-10-CM | POA: Diagnosis not present

## 2018-03-18 DIAGNOSIS — O2622 Pregnancy care for patient with recurrent pregnancy loss, second trimester: Secondary | ICD-10-CM | POA: Insufficient documentation

## 2018-03-18 DIAGNOSIS — N96 Recurrent pregnancy loss: Secondary | ICD-10-CM

## 2018-03-18 DIAGNOSIS — O2620 Pregnancy care for patient with recurrent pregnancy loss, unspecified trimester: Secondary | ICD-10-CM | POA: Diagnosis not present

## 2018-03-18 DIAGNOSIS — O09291 Supervision of pregnancy with other poor reproductive or obstetric history, first trimester: Secondary | ICD-10-CM

## 2018-03-18 NOTE — Progress Notes (Signed)
Pt seen by me, agree with assessment and plan as outlined in [facility] note.  FHR 150s today (bsus). She will have a follow up in 4 weeks for detailed anatomic survey and we will assess myomas at that time.  We will follow up results of APLAS testing from today.

## 2018-03-18 NOTE — Progress Notes (Signed)
Referring Provider:  [facility] 40 minute consultation  Alexis Cordova was referred to [place] for genetic counseling because of recurrent pregnancy loss and advanced maternal age.  The patient will be 37 years old at the time of delivery.  This note summarizes the information we discussed.    We explained that the chance of a chromosome abnormality increases with maternal age.  Chromosomes and examples of chromosome problems were reviewed.  Humans typically have 46 chromosomes in each cell, with half passed through each sperm and egg.  Any change in the number or structure of chromosomes can increase the risk of problems in the physical and mental development of a pregnancy.   Based upon age of the patient and the current gestational age, the age related chance of any chromosome abnormality was 1 in 73.  The chance of Down syndrome, the most common chromosome problem associated with maternal age, was 1 in 33 by age alone.  The risk of chromosome problems is in addition to the 3% general population risk for birth defects and mental retardation.  The greatest chance, of course, is that the baby would be born in good health.  We discussed the following prenatal screening and testing options for this pregnancy:  Prior to this visit, Alexis Cordova had circulating cell free fetal DNA testing ordered through [employer] (MaterniT21 Plus with [facility]CA).  This testing is be used to determine whether or not the baby may have either Down syndrome, trisomy 77, or trisomy 58. This test utilizes a maternal blood sample and DNA sequencing technology to isolate circulating cell free fetal DNA from maternal plasma. The fetal DNA can then be analyzed for DNA sequences that are derived from the three most common chromosomes involved in aneuploidy, chromosomes 13, 18, and 21. If the overall amount of DNA is greater than the expected level for any of these chromosomes, aneuploidy  is suspected. This testing is able to provide another means of determining the chance for one of these common chromosome conditions, without requiring an invasive procedure and traditional karyotype analysis. The patient was previously informed of the results of her recent MaterniT21 testing (performed at [place]) which yielded NEGATIVE results.  The patient's specimen showed no evidence of aneuploidy.  The detection rate for trisomy 21 is greater than 99%, for trisomy 18 is greater than 98%, and for trisomy 13 is greater than 91%.  While the results of this testing are highly accurate, they are not considered diagnostic.  Should more definitive information be desired, the patient may still consider amniocentesis.  Targeted ultrasound uses high frequency sound waves to create an image of the developing fetus.  An ultrasound is often recommended as a routine means of evaluating the pregnancy.  It is also used to screen for fetal anatomy problems (for example, a heart defect) that might be suggestive of a chromosomal or other abnormality.   Amniocentesis involves the removal of a small amount of amniotic fluid from the sac surrounding the fetus with the use of a thin needle inserted through the maternal abdomen and uterus.  Ultrasound guidance is used throughout the procedure.  Fetal cells from amniotic fluid are directly evaluated and > 99.5% of chromosome problems and > 98% of open neural tube defects can be detected. This procedure is generally performed after the 15th week of pregnancy.  The main risks to this procedure include complications leading to miscarriage in less than 1 in 200 cases (0.5%).  We obtained a  detailed family history and pregnancy history.  The family history is unremarkable for birth defects, intellectual disabilities, known chromosome abnormalities or other persons with recurrent pregnancy loss.   Alexis Cordova reported no complications or exposures in the current pregnancy that would  be expected to increase the risk for birth defects. Her history is remarkable for the loss of a female fetus when she was young, the gestational age and year of loss was not clear.  With her current partner, she had 5 very early miscarriages.  Per her report, she would being cramping and bleeding within a week or two of learning that she was pregnant.  She does not recall any genetic testing on those losses or any known reason for them.  There can be many different causes for pregnancy loss.  It is known that approximately 15% of recognized pregnancies will end in miscarriage, often with no known cause.  When a person has experienced more than three losses, we typically begin to search for specific factors causing the miscarriages. If a couple has experienced less than three losses, it is less likely that there will be a specific identifiable cause.  During our visit, we discussed several of these causes, including chromosome rearrangements, clotting factors, antibodies, and structural differences in the uterus.  There are numerous factors in the blood that are necessary for the blood to clot normally.  When some of these factors do not function properly, this can result in increased or decreased clotting.  If there is an increased amount of blood clotting, then this can disrupt blood flow to the developing fetus and result in a miscarriage.  We can test for some of these clotting factors through a blood test, called a Thrombophilia panel and the Lupus anticoagulant panel.    Other factors in the blood, called antibodies, are responsible for recognizing foreign substances in the body and "attacking" those substances.  Two known antibodies, called anticardiolipin and antiphospholipid, can increase the chance for miscarriage.  These can be identified through a blood test.  Endocrine differences may also contribute to miscarriage, so a thyroid panel was also offered.  Chromosomes are the structures inside each  cell of our bodies that have all the instructions for growth and development.  There are usually 23 pairs of chromosomes in each cell, with one member of each pair coming from each parent.  This means that we all have two copies of all of our genetic instructions.  Sometimes the instructions are all present, but are arranged differently.  A rearrangement of the chromosomes is called a translocation and is caused when one or more chromosomes break and reattach in a different location.  As long as you still have two copies of all the genetic instructions, this is does not result in medical problems.  However, when a person with a translocation makes egg or sperm cells, it is difficult for the chromosomes to divide normally.  This can result in too much or too little genetic information in the fetus, which can cause miscarriage, birth defects, or developmental differences.  We can test the chromosomes of an individual through a blood test.  Structural differences in the uterus may be another reason for pregnancy loss.  A special evaluation of the uterus, called an SIS, can be performed may be used to assess uterine structure. This cannot be done during pregnancy.   At the time of this visit, Ms. Stanard was offered antiphospholipid testing and karyotype on she and her husband.  She had  the antiphospholipid testing drawn today.  She desired to speak with her husband and determine insurance coverage on the chromosome analysis.  Ms. Hallett was also offered routine CF and SMA carrier screening, which she declined.  An ultrasound was scheduled for [redacted] weeks gestation to provide a detailed evaluation of the fetal anatomy as well as examination of the fibroids.  See MFM consultation note from Dr. Diamantina Monks regarding recommendations.  The patient was encouraged to call with questions or concerns.  We can be contacted at [phone].  Alexis Finlay, MS, CGC

## 2018-03-19 LAB — ANTIPHOSPHOLIPID SYNDROME PROF
Anticardiolipin IgG: <9 GPL U/mL (ref 0–14)
Anticardiolipin IgM: <9 MPL U/mL (ref 0–12)
DRVVT: 25.8 s (ref 0.0–47.0)
PTT Lupus Anticoagulant: 29.5 s (ref 0.0–51.9)

## 2018-03-25 ENCOUNTER — Other Ambulatory Visit (HOSPITAL_COMMUNITY)
Admission: RE | Admit: 2018-03-25 | Discharge: 2018-03-25 | Disposition: A | Discharge status: Home / Self Care | Payer: BLUE CROSS/BLUE SHIELD | Source: Ambulatory Visit | Attending: Obstetrics and Gynecology | Admitting: Obstetrics and Gynecology

## 2018-03-25 ENCOUNTER — Ambulatory Visit (INDEPENDENT_AMBULATORY_CARE_PROVIDER_SITE_OTHER): Payer: BLUE CROSS/BLUE SHIELD | Admitting: Obstetrics and Gynecology

## 2018-03-25 ENCOUNTER — Telehealth: Payer: Self-pay | Admitting: Obstetrics and Gynecology

## 2018-03-25 ENCOUNTER — Encounter: Payer: Self-pay | Admitting: Obstetrics and Gynecology

## 2018-03-25 ENCOUNTER — Telehealth: Payer: Self-pay

## 2018-03-25 VITALS — BP 110/68 | Wt 182.0 lb

## 2018-03-25 DIAGNOSIS — O09522 Supervision of elderly multigravida, second trimester: Secondary | ICD-10-CM

## 2018-03-25 DIAGNOSIS — O4692 Antepartum hemorrhage, unspecified, second trimester: Secondary | ICD-10-CM | POA: Diagnosis not present

## 2018-03-25 DIAGNOSIS — Z3A15 15 weeks gestation of pregnancy: Secondary | ICD-10-CM

## 2018-03-25 NOTE — Telephone Encounter (Signed)
Alexis Cordova was informed that the antiphospholipid testing performed at her recent visit was within normal limits.  Per Dr. Claybon Jabs, no change needed, continue baby aspirin daily.  Wilburt Finlay, MS, CGC

## 2018-03-25 NOTE — Telephone Encounter (Signed)
Pt is 15w and just started bleeding bright red blood.  [id number]  Pt has a history of miscarriage. States she has no pain/cramping. It's pink to bright red c wiping and on pad.  Tx'd to SP to schedule.

## 2018-03-25 NOTE — Progress Notes (Signed)
OB c/o Vaginal spotting starting around 12:45 only when wipes, pinkish/red

## 2018-03-26 ENCOUNTER — Ambulatory Visit (INDEPENDENT_AMBULATORY_CARE_PROVIDER_SITE_OTHER): Payer: BLUE CROSS/BLUE SHIELD | Admitting: Obstetrics and Gynecology

## 2018-03-26 ENCOUNTER — Encounter: Payer: Self-pay | Admitting: Obstetrics and Gynecology

## 2018-03-26 ENCOUNTER — Ambulatory Visit (INDEPENDENT_AMBULATORY_CARE_PROVIDER_SITE_OTHER): Payer: BLUE CROSS/BLUE SHIELD

## 2018-03-26 VITALS — BP 116/60 | Wt 182.0 lb

## 2018-03-26 DIAGNOSIS — O4692 Antepartum hemorrhage, unspecified, second trimester: Secondary | ICD-10-CM

## 2018-03-26 DIAGNOSIS — O208 Other hemorrhage in early pregnancy: Secondary | ICD-10-CM | POA: Diagnosis not present

## 2018-03-26 DIAGNOSIS — O09292 Supervision of pregnancy with other poor reproductive or obstetric history, second trimester: Secondary | ICD-10-CM | POA: Diagnosis not present

## 2018-03-26 DIAGNOSIS — O09522 Supervision of elderly multigravida, second trimester: Secondary | ICD-10-CM

## 2018-03-26 DIAGNOSIS — D219 Benign neoplasm of connective and other soft tissue, unspecified: Secondary | ICD-10-CM

## 2018-03-26 DIAGNOSIS — O418X20 Other specified disorders of amniotic fluid and membranes, second trimester, not applicable or unspecified: Secondary | ICD-10-CM

## 2018-03-26 DIAGNOSIS — O468X2 Other antepartum hemorrhage, second trimester: Secondary | ICD-10-CM

## 2018-03-26 DIAGNOSIS — Z3A15 15 weeks gestation of pregnancy: Secondary | ICD-10-CM | POA: Diagnosis not present

## 2018-03-26 DIAGNOSIS — Z3481 Encounter for supervision of other normal pregnancy, first trimester: Secondary | ICD-10-CM

## 2018-03-26 NOTE — Progress Notes (Signed)
ROB Spotting has gone away

## 2018-03-26 NOTE — Progress Notes (Signed)
    Routine Prenatal Care Visit  Subjective  Alexis Cordova is a 37 y.o. G7P0060 at [redacted]w[redacted]d being seen today for ongoing prenatal care.  She is currently monitored for the following issues for this high-risk pregnancy and has First trimester bleeding; Obesity (BMI 30-39.9); History of miscarriage, currently pregnant, first trimester; Advanced maternal age in multigravida, second trimester; and Recurrent pregnancy loss with current pregnancy on their problem list.  ----------------------------------------------------------------------------------- Patient reports no complaints and vaginal bleeding which starting today at 12:45. She has not had pain or cramping. She was at work and noticed blood when she wiped. . This is her second bleeding event of this pregnancy. She reports her last intercourse was about 4 days ago.  Location:  Vagina Severity: mild Duration: 4-5 hours Timing: While at work      Contractions: Not present. Vag. Bleeding: None.   . Denies leaking of fluid.  ----------------------------------------------------------------------------------- The following portions of the patient's history were reviewed and updated as appropriate: allergies, current medications, past family history, past medical history, past social history, past surgical history and problem list. Problem list updated.   Objective  Blood pressure 110/68, weight 182 lb (82.6 kg), last menstrual period 12/07/2017. Pregravid weight 178 lb (80.7 kg) Total Weight Gain 4 lb (1.814 kg) Urinalysis:      Fetal Status: Fetal Heart Rate (bpm): 156         General:  Alert, oriented and cooperative. Patient is in no acute distress.  Skin: Skin is warm and dry. No rash noted.   Cardiovascular: Normal heart rate noted  Respiratory: Normal respiratory effort, no problems with respiration noted  Abdomen: Soft, gravid, appropriate for gestational age. Pain/Pressure: Absent     Pelvic:  Cervical exam performed Dilation:  Closed Effacement (%): 0 Station: -3 Blood in vaginal vault, aobut 5cc. One small grape sized dark red clot removed from the vagina. No active bleeding.    Extremities: Normal range of motion.     ental Status: Normal mood and affect. Normal behavior. Normal judgment and thought content.     Assessment   37 y.o. [id number] at [redacted]w[redacted]d by  09/13/2018, by Last Menstrual Period presenting for work-in prenatal visit  Plan   Pregnancy#6 Problems (from 12/07/17 to present)    No problems associated with this episode.       Gestational age appropriate obstetric precautions including but not limited to vaginal bleeding, contractions, leaking of fluid and fetal movement were reviewed in detail with the patient.    Will have her follow up tomorrow to access placentation and cervical length.  She was given a note for work to be off until Monday. Rh+ blood type Recommended pelvic rest until [redacted] weeks gestation, longer depending on the placentation of the pregnancy.  Advised to go to the ER or call the office if the bleeding returns or increases.    Return in about 1 day (around 03/26/2018) for [place] and [place] , okay to double book. Adrian Prows MD [facility], [facility] 03/26/18 12:29 AM

## 2018-03-26 NOTE — Progress Notes (Signed)
    Routine Prenatal Care Visit  Subjective  Alexis Cordova is a 37 y.o. G7P0060 at [redacted]w[redacted]d being seen today for ongoing prenatal care.  She is currently monitored for the following issues for this high-risk pregnancy and has First trimester bleeding; Obesity (BMI 30-39.9); History of miscarriage, currently pregnant, first trimester; Advanced maternal age in multigravida, second trimester; Recurrent pregnancy loss with current pregnancy; and Subchorionic hemorrhage of placenta in second trimester on their problem list.  ----------------------------------------------------------------------------------- Patient reports no complaints.   Contractions: Not present. Vag. Bleeding: None.  Movement: Absent. Denies leaking of fluid.  ----------------------------------------------------------------------------------- The following portions of the patient's history were reviewed and updated as appropriate: allergies, current medications, past family history, past medical history, past social history, past surgical history and problem list. Problem list updated.   Objective  Blood pressure 116/60, weight 182 lb (82.6 kg), last menstrual period 12/07/2017. Pregravid weight 178 lb (80.7 kg) Total Weight Gain 4 lb (1.814 kg) Urinalysis:      Fetal Status: Fetal Heart Rate (bpm): 156   Movement: Absent     General:  Alert, oriented and cooperative. Patient is in no acute distress.  Skin: Skin is warm and dry. No rash noted.   Cardiovascular: Normal heart rate noted  Respiratory: Normal respiratory effort, no problems with respiration noted  Abdomen: Soft, gravid, appropriate for gestational age. Pain/Pressure: Absent     Pelvic:  Cervical exam deferred        Extremities: Normal range of motion.     ental Status: Normal mood and affect. Normal behavior. Normal judgment and thought content.     Assessment   37 y.o. [id number] at [redacted]w[redacted]d by  09/13/2018, by Last Menstrual Period presenting for routine  prenatal visit  Plan   Pregnancy#6 Problems (from 12/07/17 to present)    No problems associated with this episode.       Gestational age appropriate obstetric precautions including but not limited to vaginal bleeding, contractions, leaking of fluid and fetal movement were reviewed in detail with the patient.    No further bleeding. Ultrasound today showed small subchorionic hemorrhage. Discussed continue pelvic rest. Cervical length is reassuring. Alexis Cordova feels reassured by today's ultrasound.   Return in about 1 week (around 04/02/2018) for next week as planned.  Adrian Prows MD [facility], [facility] 03/26/18 6:33 PM

## 2018-03-29 NOTE — Progress Notes (Signed)
Pt seen by me, agree with assessment and plan as outlined in [facility] note 

## 2018-03-30 ENCOUNTER — Other Ambulatory Visit: Payer: BLUE CROSS/BLUE SHIELD

## 2018-03-30 LAB — CERVICOVAGINAL ANCILLARY ONLY
BACTERIAL VAGINITIS: NEGATIVE
Candida vaginitis: NEGATIVE
Chlamydia: NEGATIVE
Neisseria Gonorrhea: NEGATIVE
TRICH (WINDOWPATH): NEGATIVE

## 2018-03-31 NOTE — Progress Notes (Signed)
negative

## 2018-04-01 ENCOUNTER — Ambulatory Visit (INDEPENDENT_AMBULATORY_CARE_PROVIDER_SITE_OTHER): Payer: BLUE CROSS/BLUE SHIELD | Admitting: Obstetrics & Gynecology

## 2018-04-01 VITALS — BP 120/80 | Wt 186.0 lb

## 2018-04-01 DIAGNOSIS — O0992 Supervision of high risk pregnancy, unspecified, second trimester: Secondary | ICD-10-CM

## 2018-04-01 DIAGNOSIS — O09522 Supervision of elderly multigravida, second trimester: Secondary | ICD-10-CM

## 2018-04-01 DIAGNOSIS — Z3A16 16 weeks gestation of pregnancy: Secondary | ICD-10-CM

## 2018-04-01 NOTE — Progress Notes (Signed)
  Subjective  Fetal Movement? no Contractions? no Leaking Fluid? no Vaginal Bleeding? No further bleeding or spotting this week  Objective  BP 120/80   Wt 186 lb (84.4 kg)   LMP 12/07/2017 (Exact Date)   BMI 35.14 kg/m  General: NAD Pumonary: no increased work of breathing Abdomen: gravid, non-tender Extremities: no edema Psychiatric: mood appropriate, affect full  Assessment  37 y.o. G7P0060 at [redacted]w[redacted]d by  09/13/2018, by Last Menstrual Period presenting for routine prenatal visit  Plan   Problem List Items Addressed This Visit      Other   Advanced maternal age in multigravida, second trimester   Supervision of high risk pregnancy, antepartum, second trimester    Other Visit Diagnoses    [redacted] weeks gestation of pregnancy    -  Primary     Clinic [facility] Prenatal Labs  Dating [place] Blood type: O/Positive/-- (08/15 1451)   Genetic Screen NIPS: normal XY Antibody:Negative (08/15 1451)  Anatomic [place] Planned at [facility] 9/30 Rubella: 2.79 (08/15 1451) Varicella:I  GTT  28 weeks RPR: Non Reactive (08/15 1451)   Rhogam na HBsAg: Negative (08/15 1451)   TDaP vaccine    30 weeks  Flu Shot: HIV: Non Reactive (08/15 1451)   Baby Food                   Breast             GBS: not yet  Contraception   unsure [id number] normal  [place] DP 9/30  Barnett Applebaum, MD, [place], [facility] 04/01/2018  9:55 AM

## 2018-04-08 ENCOUNTER — Other Ambulatory Visit: Payer: Self-pay

## 2018-04-12 ENCOUNTER — Ambulatory Visit
Admission: RE | Admit: 2018-04-12 | Discharge: 2018-04-12 | Disposition: A | Discharge status: Home / Self Care | Payer: BLUE CROSS/BLUE SHIELD | Source: Ambulatory Visit | Attending: Maternal & Fetal Medicine | Admitting: Maternal & Fetal Medicine

## 2018-04-12 ENCOUNTER — Other Ambulatory Visit: Payer: Self-pay

## 2018-04-12 DIAGNOSIS — O99212 Obesity complicating pregnancy, second trimester: Secondary | ICD-10-CM | POA: Insufficient documentation

## 2018-04-12 DIAGNOSIS — Z3A18 18 weeks gestation of pregnancy: Secondary | ICD-10-CM | POA: Diagnosis not present

## 2018-04-12 DIAGNOSIS — O09522 Supervision of elderly multigravida, second trimester: Secondary | ICD-10-CM | POA: Diagnosis not present

## 2018-04-12 DIAGNOSIS — E669 Obesity, unspecified: Secondary | ICD-10-CM

## 2018-04-30 ENCOUNTER — Encounter: Payer: Self-pay | Admitting: Obstetrics & Gynecology

## 2018-04-30 ENCOUNTER — Ambulatory Visit (INDEPENDENT_AMBULATORY_CARE_PROVIDER_SITE_OTHER): Payer: BLUE CROSS/BLUE SHIELD | Admitting: Obstetrics & Gynecology

## 2018-04-30 VITALS — BP 100/60 | Wt 190.0 lb

## 2018-04-30 DIAGNOSIS — O09292 Supervision of pregnancy with other poor reproductive or obstetric history, second trimester: Secondary | ICD-10-CM

## 2018-04-30 DIAGNOSIS — O0992 Supervision of high risk pregnancy, unspecified, second trimester: Secondary | ICD-10-CM

## 2018-04-30 DIAGNOSIS — O09291 Supervision of pregnancy with other poor reproductive or obstetric history, first trimester: Secondary | ICD-10-CM

## 2018-04-30 DIAGNOSIS — Z3A20 20 weeks gestation of pregnancy: Secondary | ICD-10-CM

## 2018-04-30 DIAGNOSIS — O09522 Supervision of elderly multigravida, second trimester: Secondary | ICD-10-CM

## 2018-04-30 LAB — POCT URINALYSIS DIPSTICK OB
Glucose, UA: NEGATIVE
POC,PROTEIN,UA: NEGATIVE

## 2018-04-30 NOTE — Addendum Note (Signed)
Addended by: Quintella Baton D on: 04/30/2018 11:20 AM   Modules accepted: Orders

## 2018-04-30 NOTE — Progress Notes (Signed)
  Subjective  Fetal Movement? yes Contractions? no Leaking Fluid? no Vaginal Bleeding? no [place] at [facility] MFM normal although still has 4 fibroids (no growth)    F/u US there in 2 weeks Objective  BP 100/60   Wt 190 lb (86.2 kg)   LMP 12/07/2017 (Exact Date)   BMI 35.90 kg/m  General: NAD Pumonary: no increased work of breathing Abdomen: gravid, non-tender Extremities: no edema Psychiatric: mood appropriate, affect full FHT 140s Assessment  37 y.o. G7P0060 at [redacted]w[redacted]d by  09/13/2018, by Ultrasound presenting for routine prenatal visit  Plan   Problem List Items Addressed This Visit      Other   History of miscarriage, currently pregnant, first trimester   Advanced maternal age in multigravida, second trimester   Supervision of high risk pregnancy, antepartum, second trimester    Other Visit Diagnoses    [redacted] weeks gestation of pregnancy    -  Primary       Fibroid Uterus   Fibroids risks discussed.  Monitor for PTL.  Delivery bt vag vs CS discussed, based on presentation and location at term AMA discussed  Barnett Applebaum, MD, [place], [facility] 04/30/2018  10:45 AM

## 2018-05-06 ENCOUNTER — Other Ambulatory Visit: Payer: Self-pay

## 2018-05-06 DIAGNOSIS — O09522 Supervision of elderly multigravida, second trimester: Secondary | ICD-10-CM

## 2018-05-10 ENCOUNTER — Ambulatory Visit
Admission: RE | Admit: 2018-05-10 | Discharge: 2018-05-10 | Disposition: A | Discharge status: Home / Self Care | Payer: BLUE CROSS/BLUE SHIELD | Source: Ambulatory Visit | Attending: Obstetrics & Gynecology | Admitting: Obstetrics & Gynecology

## 2018-05-10 DIAGNOSIS — O418X20 Other specified disorders of amniotic fluid and membranes, second trimester, not applicable or unspecified: Secondary | ICD-10-CM

## 2018-05-10 DIAGNOSIS — Z3A22 22 weeks gestation of pregnancy: Secondary | ICD-10-CM | POA: Insufficient documentation

## 2018-05-10 DIAGNOSIS — O2622 Pregnancy care for patient with recurrent pregnancy loss, second trimester: Secondary | ICD-10-CM | POA: Insufficient documentation

## 2018-05-10 DIAGNOSIS — O3412 Maternal care for benign tumor of corpus uteri, second trimester: Secondary | ICD-10-CM | POA: Diagnosis not present

## 2018-05-10 DIAGNOSIS — O09522 Supervision of elderly multigravida, second trimester: Secondary | ICD-10-CM | POA: Insufficient documentation

## 2018-05-10 DIAGNOSIS — D259 Leiomyoma of uterus, unspecified: Secondary | ICD-10-CM | POA: Insufficient documentation

## 2018-05-10 DIAGNOSIS — O468X2 Other antepartum hemorrhage, second trimester: Secondary | ICD-10-CM

## 2018-05-28 ENCOUNTER — Ambulatory Visit (INDEPENDENT_AMBULATORY_CARE_PROVIDER_SITE_OTHER): Payer: BLUE CROSS/BLUE SHIELD | Admitting: Obstetrics & Gynecology

## 2018-05-28 VITALS — BP 120/80 | Wt 194.0 lb

## 2018-05-28 DIAGNOSIS — Z131 Encounter for screening for diabetes mellitus: Secondary | ICD-10-CM

## 2018-05-28 DIAGNOSIS — O09522 Supervision of elderly multigravida, second trimester: Secondary | ICD-10-CM

## 2018-05-28 DIAGNOSIS — Z3A24 24 weeks gestation of pregnancy: Secondary | ICD-10-CM

## 2018-05-28 LAB — POCT URINALYSIS DIPSTICK OB
Glucose, UA: NEGATIVE
PROTEIN: NEGATIVE

## 2018-05-28 NOTE — Progress Notes (Signed)
  Subjective  Fetal Movement? yes Contractions? no Leaking Fluid? no Vaginal Bleeding? no  Objective  BP 120/80   Wt 194 lb (88 kg)   LMP 12/07/2017 (Exact Date)   BMI 36.66 kg/m  General: NAD Pumonary: no increased work of breathing Abdomen: gravid, non-tender Extremities: no edema Psychiatric: mood appropriate, affect full  Assessment  37 y.o. G7P0060 at [redacted]w[redacted]d by  09/13/2018, by Last Menstrual Period presenting for routine prenatal visit  Plan   Problem List Items Addressed This Visit      Other   Advanced maternal age in multigravida, second trimester - Primary    Other Visit Diagnoses    [redacted] weeks gestation of pregnancy       Screening for diabetes mellitus       Relevant Orders   28 Week RH+Panel    Fibroids, monitor for PTL    [place] 11/25 at [facility] for f/u PNV Glc nv  Barnett Applebaum, MD, [place], [place] [facility] 05/28/2018  10:53 AM

## 2018-05-28 NOTE — Addendum Note (Signed)
Addended by: Quintella Baton D on: 05/28/2018 10:57 AM   Modules accepted: Orders

## 2018-05-28 NOTE — Patient Instructions (Signed)

## 2018-06-03 ENCOUNTER — Other Ambulatory Visit: Payer: Self-pay

## 2018-06-03 DIAGNOSIS — O09522 Supervision of elderly multigravida, second trimester: Secondary | ICD-10-CM

## 2018-06-07 ENCOUNTER — Ambulatory Visit
Admission: RE | Admit: 2018-06-07 | Discharge: 2018-06-07 | Disposition: A | Discharge status: Home / Self Care | Payer: BLUE CROSS/BLUE SHIELD | Source: Ambulatory Visit | Attending: Maternal & Fetal Medicine | Admitting: Maternal & Fetal Medicine

## 2018-06-07 DIAGNOSIS — Z3A26 26 weeks gestation of pregnancy: Secondary | ICD-10-CM | POA: Insufficient documentation

## 2018-06-07 DIAGNOSIS — O09522 Supervision of elderly multigravida, second trimester: Secondary | ICD-10-CM | POA: Diagnosis not present

## 2018-06-07 HISTORY — DX: Benign neoplasm of connective and other soft tissue, unspecified: D21.9

## 2018-06-09 ENCOUNTER — Encounter: Payer: Self-pay | Admitting: Obstetrics & Gynecology

## 2018-06-28 ENCOUNTER — Ambulatory Visit (INDEPENDENT_AMBULATORY_CARE_PROVIDER_SITE_OTHER): Payer: BLUE CROSS/BLUE SHIELD | Admitting: Obstetrics & Gynecology

## 2018-06-28 ENCOUNTER — Other Ambulatory Visit: Payer: BLUE CROSS/BLUE SHIELD

## 2018-06-28 VITALS — BP 112/70 | Wt 196.0 lb

## 2018-06-28 DIAGNOSIS — O0993 Supervision of high risk pregnancy, unspecified, third trimester: Secondary | ICD-10-CM

## 2018-06-28 DIAGNOSIS — D219 Benign neoplasm of connective and other soft tissue, unspecified: Secondary | ICD-10-CM

## 2018-06-28 DIAGNOSIS — O09523 Supervision of elderly multigravida, third trimester: Secondary | ICD-10-CM

## 2018-06-28 DIAGNOSIS — O0992 Supervision of high risk pregnancy, unspecified, second trimester: Secondary | ICD-10-CM

## 2018-06-28 DIAGNOSIS — O09522 Supervision of elderly multigravida, second trimester: Secondary | ICD-10-CM

## 2018-06-28 DIAGNOSIS — Z131 Encounter for screening for diabetes mellitus: Secondary | ICD-10-CM | POA: Diagnosis not present

## 2018-06-28 DIAGNOSIS — Z3A29 29 weeks gestation of pregnancy: Secondary | ICD-10-CM

## 2018-06-28 NOTE — Patient Instructions (Signed)
Third Trimester of Pregnancy The third trimester is from week 28 through week 40 (months 7 through 9). The third trimester is a time when the unborn baby (fetus) is growing rapidly. At the end of the ninth month, the fetus is about 20 inches in length and weighs 6-10 pounds. Body changes during your third trimester Your body will continue to go through many changes during pregnancy. The changes vary from woman to woman. During the third trimester:  Your weight will continue to increase. You can expect to gain 25-35 pounds (11-16 kg) by the end of the pregnancy.  You may begin to get stretch marks on your hips, abdomen, and breasts.  You may urinate more often because the fetus is moving lower into your pelvis and pressing on your bladder.  You may develop or continue to have heartburn. This is caused by increased hormones that slow down muscles in the digestive tract.  You may develop or continue to have constipation because increased hormones slow digestion and cause the muscles that push waste through your intestines to relax.  You may develop hemorrhoids. These are swollen veins (varicose veins) in the rectum that can itch or be painful.  You may develop swollen, bulging veins (varicose veins) in your legs.  You may have increased body aches in the pelvis, back, or thighs. This is due to weight gain and increased hormones that are relaxing your joints.  You may have changes in your hair. These can include thickening of your hair, rapid growth, and changes in texture. Some women also have hair loss during or after pregnancy, or hair that feels dry or thin. Your hair will most likely return to normal after your baby is born.  Your breasts will continue to grow and they will continue to become tender. A yellow fluid (colostrum) may leak from your breasts. This is the first milk you are producing for your baby.  Your belly button may stick out.  You may notice more swelling in your hands,  face, or ankles.  You may have increased tingling or numbness in your hands, arms, and legs. The skin on your belly may also feel numb.  You may feel short of breath because of your expanding uterus.  You may have more problems sleeping. This can be caused by the size of your belly, increased need to urinate, and an increase in your body's metabolism.  You may notice the fetus "dropping," or moving lower in your abdomen (lightening).  You may have increased vaginal discharge.  You may notice your joints feel loose and you may have pain around your pelvic bone.  What to expect at prenatal visits You will have prenatal exams every 2 weeks until week 36. Then you will have weekly prenatal exams. During a routine prenatal visit:  You will be weighed to make sure you and the baby are growing normally.  Your blood pressure will be taken.  Your abdomen will be measured to track your baby's growth.  The fetal heartbeat will be listened to.  Any test results from the previous visit will be discussed.  You may have a cervical check near your due date to see if your cervix has softened or thinned (effaced).  You will be tested for Group B streptococcus. This happens between 35 and 37 weeks.  Your health care provider may ask you:  What your birth plan is.  How you are feeling.  If you are feeling the baby move.  If you have had   any abnormal symptoms, such as leaking fluid, bleeding, severe headaches, or abdominal cramping.  If you are using any tobacco products, including cigarettes, chewing tobacco, and electronic cigarettes.  If you have any questions.  Other tests or screenings that may be performed during your third trimester include:  Blood tests that check for low iron levels (anemia).  Fetal testing to check the health, activity level, and growth of the fetus. Testing is done if you have certain medical conditions or if there are problems during the  pregnancy.  Nonstress test (NST). This test checks the health of your baby to make sure there are no signs of problems, such as the baby not getting enough oxygen. During this test, a belt is placed around your belly. The baby is made to move, and its heart rate is monitored during movement.  What is false labor? False labor is a condition in which you feel small, irregular tightenings of the muscles in the womb (contractions) that usually go away with rest, changing position, or drinking water. These are called Braxton Hicks contractions. Contractions may last for hours, days, or even weeks before true labor sets in. If contractions come at regular intervals, become more frequent, increase in intensity, or become painful, you should see your health care provider. What are the signs of labor?  Abdominal cramps.  Regular contractions that start at 10 minutes apart and become stronger and more frequent with time.  Contractions that start on the top of the uterus and spread down to the lower abdomen and back.  Increased pelvic pressure and dull back pain.  A watery or bloody mucus discharge that comes from the vagina.  Leaking of amniotic fluid. This is also known as your "water breaking." It could be a slow trickle or a gush. Let your health care provider know if it has a color or strange odor. If you have any of these signs, call your health care provider right away, even if it is before your due date. Follow these instructions at home: Medicines  Follow your health care provider's instructions regarding medicine use. Specific medicines may be either safe or unsafe to take during pregnancy.  Take a prenatal vitamin that contains at least 600 micrograms (mcg) of folic acid.  If you develop constipation, try taking a stool softener if your health care provider approves. Eating and drinking  Eat a balanced diet that includes fresh fruits and vegetables, whole grains, good sources of protein  such as meat, eggs, or tofu, and low-fat dairy. Your health care provider will help you determine the amount of weight gain that is right for you.  Avoid raw meat and uncooked cheese. These carry germs that can cause birth defects in the baby.  If you have low calcium intake from food, talk to your health care provider about whether you should take a daily calcium supplement.  Eat four or five small meals rather than three large meals a day.  Limit foods that are high in fat and processed sugars, such as fried and sweet foods.  To prevent constipation: ? Drink enough fluid to keep your urine clear or pale yellow. ? Eat foods that are high in fiber, such as fresh fruits and vegetables, whole grains, and beans. Activity  Exercise only as directed by your health care provider. Most women can continue their usual exercise routine during pregnancy. Try to exercise for 30 minutes at least 5 days a week. Stop exercising if you experience uterine contractions.  Avoid heavy   lifting.  Do not exercise in extreme heat or humidity, or at high altitudes.  Wear low-heel, comfortable shoes.  Practice good posture.  You may continue to have sex unless your health care provider tells you otherwise. Relieving pain and discomfort  Take frequent breaks and rest with your legs elevated if you have leg cramps or low back pain.  Take warm sitz baths to soothe any pain or discomfort caused by hemorrhoids. Use hemorrhoid cream if your health care provider approves.  Wear a good support bra to prevent discomfort from breast tenderness.  If you develop varicose veins: ? Wear support pantyhose or compression stockings as told by your healthcare provider. ? Elevate your feet for 15 minutes, 3-4 times a day. Prenatal care  Write down your questions. Take them to your prenatal visits.  Keep all your prenatal visits as told by your health care provider. This is important. Safety  Wear your seat belt at  all times when driving.  Make a list of emergency phone numbers, including numbers for family, friends, the hospital, and police and fire departments. General instructions  Avoid cat litter boxes and soil used by cats. These carry germs that can cause birth defects in the baby. If you have a cat, ask someone to clean the litter box for you.  Do not travel far distances unless it is absolutely necessary and only with the approval of your health care provider.  Do not use hot tubs, steam rooms, or saunas.  Do not drink alcohol.  Do not use any products that contain nicotine or tobacco, such as cigarettes and e-cigarettes. If you need help quitting, ask your health care provider.  Do not use any medicinal herbs or unprescribed drugs. These chemicals affect the formation and growth of the baby.  Do not douche or use tampons or scented sanitary pads.  Do not cross your legs for long periods of time.  To prepare for the arrival of your baby: ? Take prenatal classes to understand, practice, and ask questions about labor and delivery. ? Make a trial run to the hospital. ? Visit the hospital and tour the maternity area. ? Arrange for maternity or paternity leave through employers. ? Arrange for family and friends to take care of pets while you are in the hospital. ? Purchase a rear-facing car seat and make sure you know how to install it in your car. ? Pack your hospital bag. ? Prepare the baby's nursery. Make sure to remove all pillows and stuffed animals from the baby's crib to prevent suffocation.  Visit your dentist if you have not gone during your pregnancy. Use a soft toothbrush to brush your teeth and be gentle when you floss. Contact a health care provider if:  You are unsure if you are in labor or if your water has broken.  You become dizzy.  You have mild pelvic cramps, pelvic pressure, or nagging pain in your abdominal area.  You have lower back pain.  You have persistent  nausea, vomiting, or diarrhea.  You have an unusual or bad smelling vaginal discharge.  You have pain when you urinate. Get help right away if:  Your water breaks before 37 weeks.  You have regular contractions less than 5 minutes apart before 37 weeks.  You have a fever.  You are leaking fluid from your vagina.  You have spotting or bleeding from your vagina.  You have severe abdominal pain or cramping.  You have rapid weight loss or weight gain.    You have shortness of breath with chest pain.  You notice sudden or extreme swelling of your face, hands, ankles, feet, or legs.  Your baby makes fewer than 10 movements in 2 hours.  You have severe headaches that do not go away when you take medicine.  You have vision changes. Summary  The third trimester is from week 28 through week 40, months 7 through 9. The third trimester is a time when the unborn baby (fetus) is growing rapidly.  During the third trimester, your discomfort may increase as you and your baby continue to gain weight. You may have abdominal, leg, and back pain, sleeping problems, and an increased need to urinate.  During the third trimester your breasts will keep growing and they will continue to become tender. A yellow fluid (colostrum) may leak from your breasts. This is the first milk you are producing for your baby.  False labor is a condition in which you feel small, irregular tightenings of the muscles in the womb (contractions) that eventually go away. These are called Braxton Hicks contractions. Contractions may last for hours, days, or even weeks before true labor sets in.  Signs of labor can include: abdominal cramps; regular contractions that start at 10 minutes apart and become stronger and more frequent with time; watery or bloody mucus discharge that comes from the vagina; increased pelvic pressure and dull back pain; and leaking of amniotic fluid. This information is not intended to replace advice  given to you by your health care provider. Make sure you discuss any questions you have with your health care provider. Document Released: 06/24/2001 Document Revised: 12/06/2015 Document Reviewed: 08/31/2012 Elsevier Interactive Patient Education  2017 Elsevier Inc.  

## 2018-06-28 NOTE — Progress Notes (Signed)
  Subjective  Fetal Movement? yes Contractions? no Leaking Fluid? no Vaginal Bleeding? no [place] reviewed from DP last month    Good growth.  Fibroids unchanged Objective  BP 112/70   Wt 196 lb (88.9 kg)   LMP 12/07/2017 (Exact Date)   BMI 37.03 kg/m  General: NAD Pumonary: no increased work of breathing Abdomen: gravid, non-tender Extremities: no edema Psychiatric: mood appropriate, affect full  Assessment  37 y.o. G7P0060 at [redacted]w[redacted]d by  09/13/2018, by Last Menstrual Period presenting for routine prenatal visit  Plan   Problem List Items Addressed This Visit      Other   Advanced maternal age in multigravida, second trimester   Supervision of high risk pregnancy, antepartum, second trimester   Relevant Orders   US OB Follow Up    Other Visit Diagnoses    [redacted] weeks gestation of pregnancy    -  Primary   Fibroids       Relevant Orders   US OB Follow Up    [place] growth January Monitor for s/sx PTL.  Barnett Applebaum, MD, [facility], [facility] 06/28/2018  10:52 AM

## 2018-06-29 LAB — 28 WEEK RH+PANEL
BASOS ABS: 0 10*3/uL (ref 0.0–0.2)
Basos: 0 %
EOS (ABSOLUTE): 0.1 10*3/uL (ref 0.0–0.4)
Eos: 1 %
GESTATIONAL DIABETES SCREEN: 127 mg/dL (ref 65–139)
HEMATOCRIT: 35.2 % (ref 34.0–46.6)
HEMOGLOBIN: 11.8 g/dL (ref 11.1–15.9)
HIV SCREEN 4TH GENERATION: NONREACTIVE
Immature Grans (Abs): 0.1 10*3/uL (ref 0.0–0.1)
Immature Granulocytes: 1 %
LYMPHS ABS: 1.3 10*3/uL (ref 0.7–3.1)
Lymphs: 20 %
MCH: 28.6 pg (ref 26.6–33.0)
MCHC: 33.5 g/dL (ref 31.5–35.7)
MCV: 85 fL (ref 79–97)
MONOS ABS: 0.4 10*3/uL (ref 0.1–0.9)
Monocytes: 7 %
NEUTROS ABS: 4.7 10*3/uL (ref 1.4–7.0)
Neutrophils: 71 %
PLATELETS: 276 10*3/uL (ref 150–450)
RBC: 4.12 x10E6/uL (ref 3.77–5.28)
RDW: 13.6 % (ref 12.3–15.4)
RPR: NONREACTIVE
WBC: 6.6 10*3/uL (ref 3.4–10.8)

## 2018-07-17 ENCOUNTER — Inpatient Hospital Stay
Admission: EM | Admit: 2018-07-17 | Discharge: 2018-07-21 | DRG: 786 | Disposition: A | Discharge status: Home / Self Care | Payer: BLUE CROSS/BLUE SHIELD | Attending: Obstetrics & Gynecology | Admitting: Obstetrics & Gynecology

## 2018-07-17 ENCOUNTER — Other Ambulatory Visit: Payer: Self-pay

## 2018-07-17 DIAGNOSIS — D259 Leiomyoma of uterus, unspecified: Secondary | ICD-10-CM | POA: Diagnosis not present

## 2018-07-17 DIAGNOSIS — E669 Obesity, unspecified: Secondary | ICD-10-CM | POA: Diagnosis present

## 2018-07-17 DIAGNOSIS — O4290 Premature rupture of membranes, unspecified as to length of time between rupture and onset of labor, unspecified weeks of gestation: Secondary | ICD-10-CM | POA: Diagnosis not present

## 2018-07-17 DIAGNOSIS — O09522 Supervision of elderly multigravida, second trimester: Secondary | ICD-10-CM | POA: Diagnosis present

## 2018-07-17 DIAGNOSIS — O9081 Anemia of the puerperium: Secondary | ICD-10-CM | POA: Diagnosis not present

## 2018-07-17 DIAGNOSIS — O3413 Maternal care for benign tumor of corpus uteri, third trimester: Secondary | ICD-10-CM | POA: Diagnosis not present

## 2018-07-17 DIAGNOSIS — Z3A31 31 weeks gestation of pregnancy: Secondary | ICD-10-CM

## 2018-07-17 DIAGNOSIS — Z98891 History of uterine scar from previous surgery: Secondary | ICD-10-CM

## 2018-07-17 DIAGNOSIS — O99214 Obesity complicating childbirth: Secondary | ICD-10-CM | POA: Diagnosis not present

## 2018-07-17 DIAGNOSIS — D62 Acute posthemorrhagic anemia: Secondary | ICD-10-CM | POA: Diagnosis not present

## 2018-07-17 DIAGNOSIS — Z87891 Personal history of nicotine dependence: Secondary | ICD-10-CM | POA: Diagnosis not present

## 2018-07-17 DIAGNOSIS — O42913 Preterm premature rupture of membranes, unspecified as to length of time between rupture and onset of labor, third trimester: Principal | ICD-10-CM | POA: Diagnosis present

## 2018-07-17 DIAGNOSIS — O418X20 Other specified disorders of amniotic fluid and membranes, second trimester, not applicable or unspecified: Secondary | ICD-10-CM

## 2018-07-17 DIAGNOSIS — O42919 Preterm premature rupture of membranes, unspecified as to length of time between rupture and onset of labor, unspecified trimester: Secondary | ICD-10-CM | POA: Diagnosis present

## 2018-07-17 DIAGNOSIS — O468X2 Other antepartum hemorrhage, second trimester: Secondary | ICD-10-CM

## 2018-07-17 DIAGNOSIS — O42013 Preterm premature rupture of membranes, onset of labor within 24 hours of rupture, third trimester: Secondary | ICD-10-CM | POA: Diagnosis not present

## 2018-07-17 LAB — URINALYSIS, ROUTINE W REFLEX MICROSCOPIC
Bilirubin Urine: NEGATIVE
Glucose, UA: NEGATIVE mg/dL
Hgb urine dipstick: NEGATIVE
Ketones, ur: NEGATIVE mg/dL
Leukocytes, UA: NEGATIVE
Nitrite: NEGATIVE
Protein, ur: NEGATIVE mg/dL
Specific Gravity, Urine: 1.004 — ABNORMAL LOW (ref 1.005–1.030)
pH: 7 (ref 5.0–8.0)

## 2018-07-17 LAB — CBC
HCT: 38.5 % (ref 36.0–46.0)
Hemoglobin: 12.7 g/dL (ref 12.0–15.0)
MCH: 28.3 pg (ref 26.0–34.0)
MCHC: 33 g/dL (ref 30.0–36.0)
MCV: 85.9 fL (ref 80.0–100.0)
Platelets: 288 10*3/uL (ref 150–400)
RBC: 4.48 MIL/uL (ref 3.87–5.11)
RDW: 14.3 % (ref 11.5–15.5)
WBC: 8.5 10*3/uL (ref 4.0–10.5)
nRBC: 0 % (ref 0.0–0.2)

## 2018-07-17 LAB — URINE DRUG SCREEN, QUALITATIVE (ARMC ONLY)
Amphetamines, Ur Screen: NOT DETECTED
Barbiturates, Ur Screen: NOT DETECTED
Benzodiazepine, Ur Scrn: NOT DETECTED
CANNABINOID 50 NG, UR ~~LOC~~: NOT DETECTED
Cocaine Metabolite,Ur ~~LOC~~: NOT DETECTED
MDMA (Ecstasy)Ur Screen: NOT DETECTED
METHADONE SCREEN, URINE: NOT DETECTED
Opiate, Ur Screen: NOT DETECTED
Phencyclidine (PCP) Ur S: NOT DETECTED
Tricyclic, Ur Screen: NOT DETECTED

## 2018-07-17 LAB — TYPE AND SCREEN
ABO/RH(D): O POS
Antibody Screen: NEGATIVE

## 2018-07-17 LAB — GROUP B STREP BY PCR: Group B strep by PCR: NEGATIVE

## 2018-07-17 MED ORDER — LACTATED RINGERS IV SOLN
INTRAVENOUS | Status: DC
  Administered 2018-07-17: 100 mL via INTRAVENOUS

## 2018-07-17 MED ORDER — HYDROXYZINE HCL 50 MG PO TABS
50.0000 mg | ORAL_TABLET | Freq: Four times a day (QID) | ORAL | Status: DC | PRN
  Filled 2018-07-17: qty 1

## 2018-07-17 MED ORDER — ACETAMINOPHEN 325 MG PO TABS: 650.0000 mg | ORAL_TABLET | ORAL | Status: DC | PRN

## 2018-07-17 MED ORDER — FAMOTIDINE 20 MG PO TABS
20.0000 mg | ORAL_TABLET | Freq: Every day | ORAL | Status: DC
  Administered 2018-07-17: 20 mg via ORAL
  Filled 2018-07-17: qty 1

## 2018-07-17 MED ORDER — OXYTOCIN 40 UNITS IN LACTATED RINGERS INFUSION - SIMPLE MED
2.5000 [IU]/h | INTRAVENOUS | Status: DC
  Administered 2018-07-18: 100 mL via INTRAVENOUS
  Administered 2018-07-18: 500 mL via INTRAVENOUS
  Filled 2018-07-17: qty 1000

## 2018-07-17 MED ORDER — BETAMETHASONE SOD PHOS & ACET 6 (3-3) MG/ML IJ SUSP
12.0000 mg | INTRAMUSCULAR | Status: DC
  Administered 2018-07-17: 12 mg via INTRAMUSCULAR

## 2018-07-17 MED ORDER — CALCIUM GLUCONATE 10 % IV SOLN
INTRAVENOUS | Status: AC
  Filled 2018-07-17: qty 10

## 2018-07-17 MED ORDER — PRENATAL MULTIVITAMIN CH: 1.0000 | ORAL_TABLET | Freq: Every day | ORAL | Status: DC

## 2018-07-17 MED ORDER — ONDANSETRON HCL 4 MG/2ML IJ SOLN: 4.0000 mg | Freq: Four times a day (QID) | INTRAMUSCULAR | Status: DC | PRN

## 2018-07-17 MED ORDER — MAGNESIUM SULFATE BOLUS VIA INFUSION
4.0000 g | Freq: Once | INTRAVENOUS | Status: AC
  Administered 2018-07-17: 4 g via INTRAVENOUS
  Filled 2018-07-17: qty 500

## 2018-07-17 MED ORDER — SODIUM CHLORIDE 0.9 % IV SOLN
2.0000 g | Freq: Once | INTRAVENOUS | Status: AC
  Administered 2018-07-17: 2 g via INTRAVENOUS
  Filled 2018-07-17: qty 2000

## 2018-07-17 MED ORDER — OXYTOCIN BOLUS FROM INFUSION: 500.0000 mL | Freq: Once | INTRAVENOUS | Status: DC

## 2018-07-17 MED ORDER — LACTATED RINGERS IV SOLN: 500.0000 mL | INTRAVENOUS | Status: DC | PRN

## 2018-07-17 MED ORDER — BUTORPHANOL TARTRATE 2 MG/ML IJ SOLN: 1.0000 mg | INTRAMUSCULAR | Status: DC | PRN

## 2018-07-17 MED ORDER — MAGNESIUM SULFATE 40 G IN LACTATED RINGERS - SIMPLE
2.0000 g/h | INTRAVENOUS | Status: DC
  Administered 2018-07-17: 2 g/h via INTRAVENOUS
  Filled 2018-07-17: qty 500

## 2018-07-17 MED ORDER — SODIUM CHLORIDE 0.9 % IV SOLN
1.0000 g | INTRAVENOUS | Status: DC
  Administered 2018-07-17 – 2018-07-18 (×3): 1 g via INTRAVENOUS
  Filled 2018-07-17 (×3): qty 1000

## 2018-07-17 MED ORDER — BETAMETHASONE SOD PHOS & ACET 6 (3-3) MG/ML IJ SUSP
INTRAMUSCULAR | Status: AC
  Administered 2018-07-17: 12 mg via INTRAMUSCULAR
  Filled 2018-07-17: qty 5

## 2018-07-17 NOTE — Progress Notes (Signed)
ANTEPARTUM Progress Note  Pt resting well this evening.    No pain.    No reported SE to magnesium, steroids AF, VSS    Abd Gravid, NT    A NST procedure was performed with FHR monitoring and a normal baseline established, appropriate time of 20-40 minutes of evaluation, and accels >2 seen w 15x15 characteristics.  Results show a REACTIVE NST.     One decel w recovery. Marked variability at times.  Results for orders placed or performed during the hospital encounter of 07/17/18  Group B strep by PCR  Result Value Ref Range   Group B strep by PCR NEGATIVE NEGATIVE  CBC  Result Value Ref Range   WBC 8.5 4.0 - 10.5 K/uL   RBC 4.48 3.87 - 5.11 MIL/uL   Hemoglobin 12.7 12.0 - 15.0 g/dL   HCT 38.5 36.0 - 46.0 %   MCV 85.9 80.0 - 100.0 fL   MCH 28.3 26.0 - 34.0 pg   MCHC 33.0 30.0 - 36.0 g/dL   RDW 14.3 11.5 - 15.5 %   Platelets 288 150 - 400 K/uL   nRBC 0.0 0.0 - 0.2 %  Urinalysis, Routine w reflex microscopic  Result Value Ref Range   Color, Urine STRAW (A) YELLOW   APPearance CLEAR (A) CLEAR   Specific Gravity, Urine 1.004 (L) 1.005 - 1.030   pH 7.0 5.0 - 8.0   Glucose, UA NEGATIVE NEGATIVE mg/dL   Hgb urine dipstick NEGATIVE NEGATIVE   Bilirubin Urine NEGATIVE NEGATIVE   Ketones, ur NEGATIVE NEGATIVE mg/dL   Protein, ur NEGATIVE NEGATIVE mg/dL   Nitrite NEGATIVE NEGATIVE   Leukocytes, UA NEGATIVE NEGATIVE  Urine Drug Screen, Qualitative ([facility] only)  Result Value Ref Range   Tricyclic, Ur Screen NONE DETECTED NONE DETECTED   Amphetamines, Ur Screen NONE DETECTED NONE DETECTED   MDMA (Ecstasy)Ur Screen NONE DETECTED NONE DETECTED   Cocaine Metabolite,Ur [place] NONE DETECTED NONE DETECTED   Opiate, Ur Screen NONE DETECTED NONE DETECTED   Phencyclidine (PCP) Ur S NONE DETECTED NONE DETECTED   Cannabinoid 50 Ng, Ur [place] NONE DETECTED NONE DETECTED   Barbiturates, Ur Screen NONE DETECTED NONE DETECTED   Benzodiazepine, Ur Scrn NONE DETECTED NONE DETECTED   Methadone Scn, Ur NONE  DETECTED NONE DETECTED  Type and screen [place]  Result Value Ref Range   ABO/RH(D) PENDING    Antibody Screen PENDING    Sample Expiration      07/20/2018 Performed at [facility], [address] [place], [place] [address]   Type and screen  Result Value Ref Range   ABO/RH(D) O POS    Antibody Screen NEG    Sample Expiration      07/20/2018 Performed at [facility], [address], [place], [place] [address]   A/P 38 yo G7P0060 with PPROM 31 5/7 weeks (10+hours ago) Also, AMA, Fibroid, Obesity No signs of infection    Cont ABX, IVF    BMZ x1 given    Magnesium for 24 hours No s/sx PTL.  Counseled on possibility.  Barnett Applebaum, MD, [facility], [place] [facility] 07/17/2018  9:13 PM

## 2018-07-17 NOTE — OB Triage Note (Signed)
Patient here for LOF one large gush at around 11 am, she put on a pad which so far has been dry. Intercourse 3 days ago. Denies pain, bleeding or any other symptoms.

## 2018-07-17 NOTE — H&P (Signed)
Obstetrics Admission History & Physical   CC: Leakage of fluid  HPI:  38 y.o. J1H4174 @ [redacted]w[redacted]d (09/13/2018, by Last Menstrual Period). Admitted on 07/17/2018:   Patient Active Problem List   Diagnosis Date Noted  . Preterm premature rupture of membranes (PPROM) with unknown onset of labor 07/17/2018  . Supervision of high risk pregnancy, antepartum, second trimester 04/01/2018  . Subchorionic hemorrhage of placenta in second trimester 03/26/2018  . Advanced maternal age in multigravida, second trimester 03/18/2018  . Recurrent pregnancy loss with current pregnancy   . History of miscarriage, currently pregnant, first trimester 02/25/2018  . Obesity (BMI 30-39.9) 11/09/2017  . First trimester bleeding 01/27/2017    Presents for leakage of fluid at first at 1045 this am and subsequent a few more occasions.  No pain, ctxs, VB. P[facility]atal care at: at [facility]. Pregnancy complicated by fibroid uterus and history of recurrent miscarriage. Also AMA, obesity (BMI 30-39). No recent infection, ravel, trauma.  ROS: A review of systems was performed and negative, except as stated in the above HPI. PMHx:  Past Medical History:  Diagnosis Date  . Anxiety   . Dysmenorrhea   . Fibroid   . Pap smear abnormality of cervix with ASCUS favoring benign    PSHx:  Past Surgical History:  Procedure Laterality Date  . DILATION AND CURETTAGE OF UTERUS    . HERNIA REPAIR     Umbilical hernia   Medications:  Medications Prior to Admission  Medication Sig Dispense Refill Last Dose  . aspirin 81 MG chewable tablet Chew 81 mg by mouth daily.   07/17/2018 at Unknown time  . Prenatal Vit-Fe Fumarate-FA (PRENATAL MULTIVITAMIN) TABS tablet Take 1 tablet by mouth daily at 12 noon.   07/17/2018 at Unknown time   Allergies: is allergic to shellfish allergy. OBHx:  OB History  Gravida Para Term Preterm AB Living  7 0 0 0 6 0  SAB TAB Ectopic Multiple Live Births  5 1          # Outcome Date GA Lbr Len/2nd Weight Sex  Delivery Anes PTL Lv  7 Current           6 TAB           5 SAB           4 SAB           3 SAB           2 SAB           1 SAB            YCX:KGYJEHUD/JSHFWYOVZCHY except as detailed in HPI.Marland Kitchen  No family history of birth defects. Soc Hx: Never smoker, Alcohol: none and Recreational drug use: none  Objective:   Vitals:   07/17/18 1149  BP: 138/75  Pulse: 100   Constitutional: Well nourished, well developed female in no acute distress.  HEENT: normal Skin: Warm and dry.  Cardiovascular:Regular rate and rhythm.   Extremity: trace to 1+ bilateral pedal edema Respiratory: Clear to auscultation bilateral. Normal respiratory effort Abdomen: gravid, NT, ND, VTX Back: no CVAT Neuro: DTRs 2+, Cranial nerves grossly intact Psych: Alert and Oriented x3. No memory deficits. Normal mood and affect.  MS: normal gait, normal bilateral lower extremity ROM/strength/stability.  Pelvic exam: is not limited by body habitus EGBUS: within normal limits Vagina: within normal limits and with normal mucosa blood in the vault Cervix: 1 cm, 80%, -3, VTX SSE: POS POOLING   MICRO: POS FERN  Uterus: Uterus demonstrates irritability pattern.  Adnexa: not evaluated  EFM:FHR: 140 bpm, variability: moderate,  accelerations:  Present,  decelerations:  Absent Toco: rare   Perinatal info:  Blood type: O positive Rubella- Immune Varicella -Immune TDaP tetanus status unknown to the patient RPR NR / HIV Neg/ HBsAg Neg   Assessment & Plan:   38 y.o. H4H8887 @ [redacted]w[redacted]d, Admitted on 07/17/2018: PPROM ABX BMZ Magnesium Rest, fetal monitoring  Patient has been counseled as to the risks of prematurity, including risks of respiratory depression or distress, jaundice, feeding or temperature regulation problems, neurologic concerns including hearing or visual problems, and brain complications.  Patient understands the risks of tocolytic therapies along with their inherent failure rates, and the reasons for and  against their use in individual circumstances.   Risks of infection, need for delivery, and risks to fetus discussed w PPROM.    Barnett Applebaum, MD, [facility], [facility] 07/17/2018  12:53 PM

## 2018-07-17 NOTE — Consult Note (Signed)
Neonatology Consult to Antenatal Patient:  I was asked by Dr. Kenton Kingfisher to see this patient in order to provide antenatal counseling due to premature ROM at 31 5/7 weeks.  Mrs. Menees was admitted today at [redacted]  weeks GA. She is currently not in active labor. She is getting BMZ, magnesium sulfate and IV Ampicillin.  I spoke with the patient and her husband. We discussed the worst case of delivery in the next few days. I discussed usual DR management, possible respiratory complications and need for support, IV access, feedings (mother desires breast feeding, which was encouraged), LOS, morbidity associated with prematurity, and long term outcomes. I stressed that complications become less with increasing maturity, hoping delivery will be delayed. They did not have any questions at this time.  Thank you for asking me to see this patient.  Tommie Sams, MD Neonatologist  The total length of consult was 40 min. face-to-face was more than 50% of the time spent.

## 2018-07-18 ENCOUNTER — Inpatient Hospital Stay: Payer: BLUE CROSS/BLUE SHIELD | Admitting: Anesthesiology

## 2018-07-18 ENCOUNTER — Encounter
Admission: EM | Disposition: A | Discharge status: Home / Self Care | Payer: Self-pay | Source: Home / Self Care | Attending: Obstetrics & Gynecology

## 2018-07-18 DIAGNOSIS — O42013 Preterm premature rupture of membranes, onset of labor within 24 hours of rupture, third trimester: Secondary | ICD-10-CM

## 2018-07-18 DIAGNOSIS — Z3A31 31 weeks gestation of pregnancy: Secondary | ICD-10-CM

## 2018-07-18 DIAGNOSIS — D259 Leiomyoma of uterus, unspecified: Secondary | ICD-10-CM

## 2018-07-18 DIAGNOSIS — O99214 Obesity complicating childbirth: Secondary | ICD-10-CM

## 2018-07-18 DIAGNOSIS — Z98891 History of uterine scar from previous surgery: Secondary | ICD-10-CM

## 2018-07-18 DIAGNOSIS — O3413 Maternal care for benign tumor of corpus uteri, third trimester: Secondary | ICD-10-CM

## 2018-07-18 LAB — CBC
HCT: 35.7 % — ABNORMAL LOW (ref 36.0–46.0)
Hemoglobin: 12 g/dL (ref 12.0–15.0)
MCH: 29.2 pg (ref 26.0–34.0)
MCHC: 33.6 g/dL (ref 30.0–36.0)
MCV: 86.9 fL (ref 80.0–100.0)
PLATELETS: 246 10*3/uL (ref 150–400)
RBC: 4.11 MIL/uL (ref 3.87–5.11)
RDW: 14.5 % (ref 11.5–15.5)
WBC: 13.2 10*3/uL — ABNORMAL HIGH (ref 4.0–10.5)
nRBC: 0 % (ref 0.0–0.2)

## 2018-07-18 SURGERY — Surgical Case
Anesthesia: Spinal

## 2018-07-18 MED ORDER — DIBUCAINE 1 % RE OINT: 1.0000 "application " | TOPICAL_OINTMENT | RECTAL | Status: DC | PRN

## 2018-07-18 MED ORDER — SIMETHICONE 80 MG PO CHEW
80.0000 mg | CHEWABLE_TABLET | Freq: Three times a day (TID) | ORAL | Status: DC
  Administered 2018-07-18 – 2018-07-21 (×11): 80 mg via ORAL
  Filled 2018-07-18 (×10): qty 1

## 2018-07-18 MED ORDER — MORPHINE SULFATE (PF) 0.5 MG/ML IJ SOLN
INTRAMUSCULAR | Status: DC | PRN
  Administered 2018-07-18: .1 mg via INTRATHECAL

## 2018-07-18 MED ORDER — NALBUPHINE HCL 10 MG/ML IJ SOLN: 5.0000 mg | INTRAMUSCULAR | Status: DC | PRN

## 2018-07-18 MED ORDER — BUPIVACAINE IN DEXTROSE 0.75-8.25 % IT SOLN
INTRATHECAL | Status: DC | PRN
  Administered 2018-07-18: 12 mL via INTRATHECAL

## 2018-07-18 MED ORDER — SODIUM CHLORIDE 0.9 % IV SOLN
INTRAVENOUS | Status: AC
  Filled 2018-07-18: qty 2

## 2018-07-18 MED ORDER — MENTHOL 3 MG MT LOZG
1.0000 | LOZENGE | OROMUCOSAL | Status: DC | PRN
  Filled 2018-07-18: qty 9

## 2018-07-18 MED ORDER — BUPIVACAINE HCL 0.5 % IJ SOLN
INTRAMUSCULAR | Status: DC | PRN
  Administered 2018-07-18: 10 mL

## 2018-07-18 MED ORDER — MORPHINE SULFATE (PF) 2 MG/ML IV SOLN: 1.0000 mg | INTRAVENOUS | Status: DC | PRN

## 2018-07-18 MED ORDER — KETOROLAC TROMETHAMINE 30 MG/ML IJ SOLN
30.0000 mg | Freq: Four times a day (QID) | INTRAMUSCULAR | Status: DC
  Administered 2018-07-18: 30 mg via INTRAVENOUS
  Filled 2018-07-18: qty 1

## 2018-07-18 MED ORDER — NALBUPHINE HCL 10 MG/ML IJ SOLN: 5.0000 mg | Freq: Once | INTRAMUSCULAR | Status: DC | PRN

## 2018-07-18 MED ORDER — BUPIVACAINE HCL 0.5 % IJ SOLN
10.0000 mL | Freq: Once | INTRAMUSCULAR | Status: DC
  Filled 2018-07-18: qty 10

## 2018-07-18 MED ORDER — SOD CITRATE-CITRIC ACID 500-334 MG/5ML PO SOLN
ORAL | Status: AC
  Administered 2018-07-18: 03:00:00
  Filled 2018-07-18: qty 15

## 2018-07-18 MED ORDER — LACTATED RINGERS IV SOLN: INTRAVENOUS | Status: DC

## 2018-07-18 MED ORDER — FENTANYL CITRATE (PF) 100 MCG/2ML IJ SOLN
INTRAMUSCULAR | Status: AC
  Filled 2018-07-18: qty 2

## 2018-07-18 MED ORDER — WITCH HAZEL-GLYCERIN EX PADS: 1.0000 "application " | MEDICATED_PAD | CUTANEOUS | Status: DC | PRN

## 2018-07-18 MED ORDER — ACETAMINOPHEN 500 MG PO TABS
1000.0000 mg | ORAL_TABLET | Freq: Four times a day (QID) | ORAL | Status: AC
  Administered 2018-07-18 (×3): 1000 mg via ORAL
  Filled 2018-07-18 (×3): qty 2

## 2018-07-18 MED ORDER — DIPHENHYDRAMINE HCL 25 MG PO CAPS: 25.0000 mg | ORAL_CAPSULE | Freq: Four times a day (QID) | ORAL | Status: DC | PRN

## 2018-07-18 MED ORDER — MORPHINE SULFATE (PF) 0.5 MG/ML IJ SOLN
INTRAMUSCULAR | Status: AC
  Filled 2018-07-18: qty 10

## 2018-07-18 MED ORDER — NALOXONE HCL 4 MG/10ML IJ SOLN
1.0000 ug/kg/h | INTRAVENOUS | Status: DC | PRN
  Filled 2018-07-18: qty 5

## 2018-07-18 MED ORDER — SODIUM CHLORIDE 0.9% FLUSH: 3.0000 mL | INTRAVENOUS | Status: DC | PRN

## 2018-07-18 MED ORDER — ONDANSETRON HCL 4 MG/2ML IJ SOLN
4.0000 mg | Freq: Three times a day (TID) | INTRAMUSCULAR | Status: DC | PRN
  Administered 2018-07-18: 4 mg via INTRAVENOUS
  Filled 2018-07-18: qty 2

## 2018-07-18 MED ORDER — FENTANYL CITRATE (PF) 100 MCG/2ML IJ SOLN
INTRAMUSCULAR | Status: DC | PRN
  Administered 2018-07-18: 15 ug via INTRATHECAL

## 2018-07-18 MED ORDER — SIMETHICONE 80 MG PO CHEW
80.0000 mg | CHEWABLE_TABLET | ORAL | Status: DC | PRN
  Filled 2018-07-18: qty 1

## 2018-07-18 MED ORDER — ACETAMINOPHEN 325 MG PO TABS
650.0000 mg | ORAL_TABLET | ORAL | Status: DC | PRN
  Administered 2018-07-19 – 2018-07-21 (×8): 650 mg via ORAL
  Filled 2018-07-18 (×8): qty 2

## 2018-07-18 MED ORDER — PRENATAL MULTIVITAMIN CH
1.0000 | ORAL_TABLET | Freq: Every day | ORAL | Status: DC
  Administered 2018-07-20: 1 via ORAL
  Filled 2018-07-18 (×2): qty 1

## 2018-07-18 MED ORDER — NALOXONE HCL 0.4 MG/ML IJ SOLN: 0.4000 mg | INTRAMUSCULAR | Status: DC | PRN

## 2018-07-18 MED ORDER — SODIUM CHLORIDE 0.9 % IV SOLN
2.0000 g | INTRAVENOUS | Status: AC
  Administered 2018-07-18: 2 g via INTRAVENOUS

## 2018-07-18 MED ORDER — SENNOSIDES-DOCUSATE SODIUM 8.6-50 MG PO TABS
2.0000 | ORAL_TABLET | ORAL | Status: DC
  Administered 2018-07-18 – 2018-07-21 (×3): 2 via ORAL
  Filled 2018-07-18 (×3): qty 2

## 2018-07-18 MED ORDER — SODIUM CHLORIDE 0.9 % IV SOLN
INTRAVENOUS | Status: DC | PRN
  Administered 2018-07-18: 50 ug via INTRAVENOUS
  Administered 2018-07-18: 25 ug via INTRAVENOUS

## 2018-07-18 MED ORDER — TETANUS-DIPHTH-ACELL PERTUSSIS 5-2.5-18.5 LF-MCG/0.5 IM SUSP
0.5000 mL | Freq: Once | INTRAMUSCULAR | Status: AC
  Administered 2018-07-21: 0.5 mL via INTRAMUSCULAR
  Filled 2018-07-18: qty 0.5

## 2018-07-18 MED ORDER — SIMETHICONE 80 MG PO CHEW
80.0000 mg | CHEWABLE_TABLET | ORAL | Status: DC
  Administered 2018-07-18 – 2018-07-21 (×3): 80 mg via ORAL
  Filled 2018-07-18 (×3): qty 1

## 2018-07-18 MED ORDER — ONDANSETRON HCL 4 MG/2ML IJ SOLN
INTRAMUSCULAR | Status: DC | PRN
  Administered 2018-07-18: 4 mg via INTRAVENOUS

## 2018-07-18 MED ORDER — LACTATED RINGERS IV SOLN
INTRAVENOUS | Status: DC
  Administered 2018-07-18: 02:00:00 via INTRAVENOUS

## 2018-07-18 MED ORDER — MEPERIDINE HCL 25 MG/ML IJ SOLN: 6.2500 mg | INTRAMUSCULAR | Status: DC | PRN

## 2018-07-18 MED ORDER — OXYCODONE-ACETAMINOPHEN 5-325 MG PO TABS: 1.0000 | ORAL_TABLET | ORAL | Status: DC | PRN

## 2018-07-18 MED ORDER — KETOROLAC TROMETHAMINE 30 MG/ML IJ SOLN
30.0000 mg | Freq: Four times a day (QID) | INTRAMUSCULAR | Status: AC
  Administered 2018-07-18 (×3): 30 mg via INTRAVENOUS
  Filled 2018-07-18 (×3): qty 1

## 2018-07-18 MED ORDER — DIPHENHYDRAMINE HCL 50 MG/ML IJ SOLN: 12.5000 mg | INTRAMUSCULAR | Status: DC | PRN

## 2018-07-18 MED ORDER — DIPHENHYDRAMINE HCL 25 MG PO CAPS: 25.0000 mg | ORAL_CAPSULE | ORAL | Status: DC | PRN

## 2018-07-18 MED ORDER — BUPIVACAINE HCL (PF) 0.5 % IJ SOLN
INTRAMUSCULAR | Status: AC
  Filled 2018-07-18: qty 30

## 2018-07-18 MED ORDER — OXYTOCIN 40 UNITS IN LACTATED RINGERS INFUSION - SIMPLE MED
2.5000 [IU]/h | INTRAVENOUS | Status: AC
  Administered 2018-07-18: 2.5 [IU]/h via INTRAVENOUS
  Filled 2018-07-18 (×2): qty 1000

## 2018-07-18 MED ORDER — ONDANSETRON HCL 4 MG/2ML IJ SOLN
INTRAMUSCULAR | Status: AC
  Filled 2018-07-18: qty 2

## 2018-07-18 MED ORDER — SCOPOLAMINE 1 MG/3DAYS TD PT72
1.0000 | MEDICATED_PATCH | Freq: Once | TRANSDERMAL | Status: AC
  Administered 2018-07-18: 1.5 mg via TRANSDERMAL
  Filled 2018-07-18: qty 1

## 2018-07-18 MED ORDER — COCONUT OIL OIL
1.0000 "application " | TOPICAL_OIL | Status: DC | PRN
  Filled 2018-07-18: qty 120

## 2018-07-18 MED ORDER — BUPIVACAINE ON-Q PAIN PUMP (FOR ORDER SET NO CHG): INJECTION | Status: DC

## 2018-07-18 MED ORDER — ZOLPIDEM TARTRATE 5 MG PO TABS: 5.0000 mg | ORAL_TABLET | Freq: Every evening | ORAL | Status: DC | PRN

## 2018-07-18 MED ORDER — BUPIVACAINE 0.25 % ON-Q PUMP DUAL CATH 400 ML
400.0000 mL | INJECTION | Status: DC
  Filled 2018-07-18: qty 400

## 2018-07-18 MED ORDER — SOD CITRATE-CITRIC ACID 500-334 MG/5ML PO SOLN: 30.0000 mL | ORAL | Status: AC

## 2018-07-18 SURGICAL SUPPLY — 28 items
BARRIER ADHS 3X4 INTERCEED (GAUZE/BANDAGES/DRESSINGS) ×2 IMPLANT
CANISTER SUCT 3000ML PPV (MISCELLANEOUS) ×2 IMPLANT
CATH KIT ON-Q SILVERSOAK 5IN (CATHETERS) ×4 IMPLANT
CHLORAPREP W/TINT 26ML (MISCELLANEOUS) ×4 IMPLANT
COVER WAND RF STERILE (DRAPES) IMPLANT
DERMABOND ADVANCED (GAUZE/BANDAGES/DRESSINGS) ×1
DERMABOND ADVANCED .7 DNX12 (GAUZE/BANDAGES/DRESSINGS) ×1 IMPLANT
DRESSING SURGICEL FIBRLLR 1X2 (HEMOSTASIS) ×1 IMPLANT
DRSG OPSITE POSTOP 4X10 (GAUZE/BANDAGES/DRESSINGS) ×4 IMPLANT
DRSG SURGICEL FIBRILLAR 1X2 (HEMOSTASIS) ×2
ELECT CAUTERY BLADE 6.4 (BLADE) ×2 IMPLANT
ELECT REM PT RETURN 9FT ADLT (ELECTROSURGICAL) ×2
ELECTRODE REM PT RTRN 9FT ADLT (ELECTROSURGICAL) ×1 IMPLANT
GLOVE SKINSENSE NS SZ8.0 LF (GLOVE) ×6
GLOVE SKINSENSE STRL SZ8.0 LF (GLOVE) ×6 IMPLANT
GOWN STRL REUS W/ TWL LRG LVL3 (GOWN DISPOSABLE) ×1 IMPLANT
GOWN STRL REUS W/ TWL XL LVL3 (GOWN DISPOSABLE) ×2 IMPLANT
GOWN STRL REUS W/TWL LRG LVL3 (GOWN DISPOSABLE) ×1
GOWN STRL REUS W/TWL XL LVL3 (GOWN DISPOSABLE) ×2
NS IRRIG 1000ML POUR BTL (IV SOLUTION) ×2 IMPLANT
PACK C SECTION AR (MISCELLANEOUS) ×2 IMPLANT
PAD OB MATERNITY 4.3X12.25 (PERSONAL CARE ITEMS) ×2 IMPLANT
PAD PREP 24X41 OB/GYN DISP (PERSONAL CARE ITEMS) ×2 IMPLANT
SPONGE LAP 18X18 RF (DISPOSABLE) ×2 IMPLANT
SUT MAXON ABS #0 GS21 30IN (SUTURE) ×4 IMPLANT
SUT VIC AB 1 CT1 36 (SUTURE) ×12 IMPLANT
SUT VIC AB 2-0 CT1 36 (SUTURE) IMPLANT
SUT VIC AB 4-0 FS2 27 (SUTURE) ×2 IMPLANT

## 2018-07-18 NOTE — Progress Notes (Signed)
Admit Date: 07/17/2018 Today's Date: 07/18/2018  Subjective: Postpartum Day 0: Cesarean Delivery for Fetal Intolerence/Non reassuring status after PPROM at 31 weeks; Also w Fibroids, AMA, Obesity Patient reports incisional pain and tolerating PO.  Pain is mild and she is up in chair already this am. Infant in SCN doing well.  Objective: Vital signs in last 24 hours: Temp:  [97.4 F (36.3 C)-98.2 F (36.8 C)] 97.4 F (36.3 C) (01/05 1015) Pulse Rate:  [66-103] 79 (01/05 1015) Resp:  [16-32] 18 (01/05 1015) BP: (102-145)/(53-97) 124/74 (01/05 1015) SpO2:  [90 %-100 %] 100 % (01/05 0700) Weight:  [88.5 kg] 88.5 kg (01/04 1945)  Physical Exam:  General: alert, cooperative and no distress Lochia: appropriate Uterine Fundus: firm Incision: healing well, no significant drainage, no dehiscence, no significant erythema DVT Evaluation: No evidence of DVT seen on physical exam. Negative Homan's sign.  Recent Labs    07/17/18 1313 07/18/18 1021  HGB 12.7 12.0  HCT 38.5 35.7*    Assessment/Plan: Status post Cesarean section. Doing well postoperatively.  Continue current care.  Hoyt Koch 07/18/2018, 11:07 AM

## 2018-07-18 NOTE — Discharge Summary (Signed)
OB Discharge Summary     Patient Name: Alexis Cordova DOB: 1981-05-08 MRN: [id number]  Date of admission: 07/17/2018 Delivering MD: Hoyt Koch, MD  Date of Delivery: 07/18/2018  Date of discharge: 07/21/2018  Admitting diagnosis: 30 wks preg leaking fluid Intrauterine pregnancy: [redacted]w[redacted]d     Secondary diagnosis: PPROM, Fibroids, AMA, Obesity, Fetal Intolerence to labor     Discharge diagnosis: Preterm Pregnancy Delivered, Reasons for cesarean section  Non-reassuring FHR and PPROM, Fibroids, AMA, Obesity                         Hospital course:  Onset of Labor With Unplanned C/S  38 y.o. yo [id number] at [redacted]w[redacted]d was admitted in Latent Labor with PPROM at 67 5/7 weeks on 07/17/2018. Patient had a labor course significant for a pattern of fetal heart rate decelerations of concern.  She had received one dose of BMZ and was on magnesium for neuroprotection and antibiotics. Membrane Rupture Time/Date: 10:45 AM ,07/17/2018   The patient went for cesarean section due to Non-Reassuring FHR, and delivered a Viable infant,07/18/2018  Details of operation can be found in separate operative note. Patient had an uncomplicated postpartum course.  She is ambulating,tolerating a regular diet, passing flatus, and urinating well.  Patient is discharged home in stable condition 07/21/18.                                                                 Post partum procedures: none  Complications: None  Physical exam on 07/21/2018: Vitals:   07/20/18 0718 07/20/18 1547 07/21/18 0004 07/21/18 0816  BP: 122/80 136/77 125/79 128/80  Pulse: 85 93 96 94  Resp: 18 18 20 20   Temp: 98.1 F (36.7 C) 98.3 F (36.8 C) 98.1 F (36.7 C) 97.9 F (36.6 C)  TempSrc: Oral Oral Oral Oral  SpO2: 100%  100% 100%  Weight:      Height:       General: alert, cooperative and no distress Lochia: appropriate Uterine Fundus: firm Incision: Dressing is clean, dry, and intact DVT Evaluation: No evidence of DVT seen on physical  exam.  Labs: Lab Results  Component Value Date   WBC 13.2 (H) 07/18/2018   HGB 12.0 07/18/2018   HCT 35.7 (L) 07/18/2018   MCV 86.9 07/18/2018   PLT 246 07/18/2018   No flowsheet data found.  Discharge instruction: per After Visit Summary.  Medications:  Allergies as of 07/21/2018      Reactions   Shellfish Allergy    Just sick       Medication List    STOP taking these medications   aspirin 81 MG chewable tablet     TAKE these medications   oxyCODONE-acetaminophen 5-325 MG tablet Commonly known as:  PERCOCET/ROXICET Take 1-2 tablets by mouth every 6 (six) hours as needed.   prenatal multivitamin Tabs tablet Take 1 tablet by mouth daily at 12 noon.            Discharge Care Instructions  (From admission, onward)         Start     Ordered   07/21/18 0000  Discharge wound care:    Comments:  You may apply a light dressing for minor discharge from the incision  or to keep waistbands of clothing from rubbing.  You may also have been discharge with a clear dressing in which case this will be removed at your postoperative clinic visit.  You may shower, use soap on your incision.  Avoid baths or soaking the incision in the first 6 weeks following your surgery.Marland Kitchen   07/21/18 0951          Diet: routine diet  Activity: Advance as tolerated. Pelvic rest for 6 weeks.   Outpatient follow up: Follow-up Information    Gae Dry, MD. Schedule an appointment as soon as possible for a visit in 1 week(s).   Specialty:  Obstetrics and Gynecology Why:  Post Op check Contact information: [address] [place] [address] [phone]             Postpartum contraception: None, declined Rhogam Given postpartum: no Rubella vaccine given postpartum: no Varicella vaccine given postpartum: no TDaP given antepartum or postpartum: Yes  Newborn Data: Live born female  Birth Weight: 4 lb 2.7 oz (1890 g) APGAR: 9, 9  Newborn Delivery   Birth date/time:   07/18/2018 03:06:00 Delivery type:  C-Section, Low Transverse Trial of labor:  No C-section categorization:  Primary     Baby Feeding: Breast  Disposition: NICU  SIGNED:  Rexene Agent, CNM 07/21/2018 9:57 AM

## 2018-07-18 NOTE — Anesthesia Post-op Follow-up Note (Signed)
Anesthesia QCDR form completed.        

## 2018-07-18 NOTE — Anesthesia Procedure Notes (Signed)
Spinal  Patient location during procedure: OR Start time: 07/18/2018 2:47 AM End time: 07/18/2018 2:52 AM Staffing Anesthesiologist: Durenda Hurt, MD Performed: anesthesiologist  Preanesthetic Checklist Completed: patient identified, site marked, surgical consent, pre-op evaluation, timeout performed, IV checked, risks and benefits discussed and monitors and equipment checked Spinal Block Patient position: sitting Prep: ChloraPrep Patient monitoring: heart rate, continuous pulse ox, blood pressure and cardiac monitor Approach: midline Location: L4-5 Injection technique: single-shot Needle Needle type: Whitacre and Introducer  Needle gauge: 24 G Needle length: 9 cm Additional Notes Negative paresthesia. Negative blood return. Positive free-flowing CSF. Expiration date of kit checked and confirmed. Patient tolerated procedure well, without complications.

## 2018-07-18 NOTE — Discharge Instructions (Signed)
Anterior and Posterior Colporrhaphy  Anterior or posterior colporrhaphy is surgery to fix a prolapse of organs in the genital tract. Prolapse is a condition in which an organ bulges or drops down from its normal position. Organs that commonly prolapse include the rectum, bladder, vagina, and uterus. Prolapse can affect a single organ or several organs at the same time.  You may need this surgery if you have a severe prolapse that causes symptoms that interfere with your daily life and cannot be corrected with other treatments. Prolapse often worsens when women stop having their monthly periods (menopause) because estrogen loss weakens the muscles and tissues in the genital tract. Prolapse can also happen when the organs are damaged or weakened. This commonly happens after childbirth and as a result of aging. The type of colporrhaphy done depends on the type of genital prolapse. Types of genital prolapse include the following:  Cystocele. This is a prolapse of the bladder and the upper part of the front (anterior) wall of the vagina.  Rectocele. This is a prolapse of the rectum and the lower part of the back (posterior) wall of the vagina.  Enterocele. This is a prolapse of the small intestine. It appears as a bulge under the neck of the uterus at the top of the back wall of the vagina.  Procidentia. This is a complete prolapse of the uterus and the cervix. The prolapse can be seen and felt coming out of the vagina. Tell a health care provider about:  Any allergies you have.  All medicines you are taking, including vitamins, herbs, eye drops, creams, and over-the-counter medicines.  Any problems you or family members have had with anesthetic medicines.  Any blood disorders you have.  Any surgeries you have had.  Any medical conditions you have.  Smoking history or history of alcohol use.  Whether you are pregnant or may be pregnant. What are the risks? Generally, this is a safe  procedure. However, problems may occur, including:  Infection.  Bleeding.  Allergic reactions to medicines.  Damage to other structures or organs.  Problems urinating.  Incontinence.  Nerve damage.  Painful sex.  Constipation.  A blood clot that travels to your lungs. What happens before the procedure? Staying hydrated Follow instructions from your health care provider about hydration, which may include:  Up to 2 hours before the procedure - you may continue to drink clear liquids, such as water, clear fruit juice, black coffee, and plain tea. Eating and drinking restrictions Follow instructions from your health care provider about eating and drinking, which may include:  8 hours before the procedure - stop eating heavy meals or foods such as meat, fried foods, or fatty foods.  6 hours before the procedure - stop eating light meals or foods, such as toast or cereal.  6 hours before the procedure - stop drinking milk or drinks that contain milk.  2 hours before the procedure - stop drinking clear liquids. General instructions  Ask your health care provider about: ? Changing or stopping your regular medicines. This is especially important if you are taking diabetes medicines or blood thinners. ? Taking medicines such as aspirin and ibuprofen. These medicines can thin your blood. Do not take these medicines before your procedure if your health care provider instructs you not to.  You may be given antibiotics to help prevent infection.  You may be instructed to use estrogen cream in your vagina to help prevent complications and promote healing.  Do not use  any products that contain nicotine or tobacco, such as cigarettes and e-cigarettes, for at least 2 weeks before the procedure. If you need help quitting, ask your health care provider.  Plan to have someone take you home from the hospital. Also, arrange for someone to help you with activities during recovery. What  happens during the procedure?  To lower your risk of infection: ? Your health care team will wash or sanitize their hands. ? Your skin will be washed with soap. ? Hair may be removed from the surgical area.  An IV will be inserted into one of your veins.  You will be given one or more of the following: ? A medicine to help you relax (sedative). ? A medicine to make you fall asleep (general anesthetic).  You may be given antibiotics through your IV.  You will lie down on the operating table with your feet in stirrups.  A small, thin tube (catheter) will be inserted through your urethra into your bladder to drain urine during surgery and recovery.  An instrument (vaginal speculum) will be used to hold your vagina open.  Your health care provider will perform the procedure according to the type of repair you require: Anterior repair  An incision will be made in the midline section of the front part of the vaginal wall.  A triangular-shaped piece of vaginal tissue will be removed.  The stronger, healthier tissue will be sewn together in order to support the bladder.  These incisions may be closed with stitches (sutures).  Gauze packing will be placed inside your vagina. Posterior repair  An incision will be made midline on the back wall of the vagina.  A triangular portion of vaginal skin will be removed to expose the muscle.  Excess tissue will be removed, and stronger, healthier muscle and ligament tissue will be sewn together to support the rectum.  These incisions may be closed with stitches (sutures).  Gauze packing will be placed inside your vagina. Anterior and posterior repair  Both procedures will be done during the same surgery. The procedure may vary among health care providers and hospitals. What happens after the procedure?  Your blood pressure, heart rate, breathing rate, and blood oxygen level will be monitored until the medicines you were given have worn  off.  You will be given pain medicine as needed.  You will have a small tube in place to drain your bladder (urinary catheter). This will be in place until your bladder is working properly on its own.  You may have a gauze packing in your vagina for a few days to prevent bleeding.  You will start on a liquid diet and slowly move to a regular diet.  You will be encouraged to get up and walk as soon as you are able.  You may need to wear compression stockings. They help prevent blood clots and reduce swelling in your legs.  Do not drive for 24 hours if you were given a sedative. Summary  Anterior or posterior colporrhaphy is surgery to fix a prolapse of organs in the genital tract.  The type of repair done depends on the type of prolapse that is present.  Follow instructions from your health care provider about eating and drinking before the procedure.  You will be given a general anesthetic to make you fall asleep during the procedure. This information is not intended to replace advice given to you by your health care provider. Make sure you discuss any questions you have  with your health care provider. Document Released: 09/20/2003 Document Revised: 08/07/2016 Document Reviewed: 08/07/2016 Elsevier Interactive Patient Education  2019 [employer].

## 2018-07-18 NOTE — Plan of Care (Signed)
Alert and oriented with pleasant affect. Color good, skin w&d. BBS Clear. Instructed in [employer] and demonstrates understanding of use. Instructed in Pillow Splint and Fall Prevention. V/O. Mom states she has visited Infant in [place] several times today and will rest tonight and visit Infant  again in A.M. Lower Abdominal Dressing is D&I. On Q Pump is infusing without s/s complications. Pt . Is ambulating with slow, steady gait. Denies Pain.

## 2018-07-18 NOTE — Progress Notes (Signed)
Pt. Placed on Continuous Pulse Ox. As per order. Central Telemetry notified and verify they do see Pt. Readings.

## 2018-07-18 NOTE — Lactation Note (Addendum)
This note was copied from a baby's chart. Lactation Consultation Note  Patient Name: Boy Sanjuanita Condrey [id number] Date: 07/18/2018   Mom has baby born at 31.6 weeks in SCN getting donor breast milk via tube (9.5 ml every 3 hours).  Symphony was set up in birthplace to initiate pumping.  Mom is on M/B unit now.  Reviewed supply and demand and need to pump 8 to 12 times in 24 hours or around every 3 hours to bring in mature milk.  Reviewed hand expression, pumping, collection, storage, labeling and handling of expressed colostrum/breast milk and to save even drops for [facility].  Mom has [employer].  Discussed process to get DEBP through [facility].  At the 12:45 pm pumping we got enough colostrum on flange to rub on nipples.  Explained normal course of lactation. Mom reports feeling slight nipple tenderness.  Coconut oil given to rub on flange of pump to get better seal and for comfort to rub on nipples as well.  Flange size increased to #27.  At 18:00 feeding mom expressed 3 to 5 ml which was taken to SCN for [place].  Parents have pets at home and at some point want to take a blanket that Freida Busman is wrapped in home for the pets to sniff.  Lactation name and number written on white board and encouraged to call with any questions, concerns or assistance.     Maternal Data    Feeding    LATCH Score                   Interventions    Lactation Tools Discussed/Used     Consult Status      Jarold Motto 07/18/2018, 6:03 PM

## 2018-07-18 NOTE — Anesthesia Procedure Notes (Signed)
Date/Time: 07/18/2018 2:44 AM Performed by: Durenda Hurt, MD Pre-anesthesia Checklist: Patient identified, Emergency Drugs available, Suction available, Patient being monitored and Timeout performed Patient Re-evaluated:Patient Re-evaluated prior to induction Oxygen Delivery Method: Nasal cannula Preoxygenation: Pre-oxygenation with 100% oxygen

## 2018-07-18 NOTE — Op Note (Signed)
Cesarean Section Procedure Note Indications: PPROM, Fibroids, 31 weeks, Fetal Intolerence to labor  Pre-operative Diagnosis: Intrauterine pregnancy [redacted]w[redacted]d ;  PPROM, Fibroids, 31 weeks, Fetal Intolerence to labor Post-operative Diagnosis: same, delivered. Procedure: Low Transverse Cesarean Section Surgeon: Barnett Applebaum, MD, FACOG Assistant(s): Surgery Tech Anesthesia: Spinal anesthesia Estimated Blood [id number] Complications: None; patient tolerated the procedure well. Disposition: PACU - hemodynamically stable. Condition: stable  Findings: A female infant in the cephalic presentation. Amniotic fluid - Clear  Birth weight 4-3 lbs.  Apgars of 9 and 9.  Intact placenta with a three-vessel cord. Multiple fibroids noted. Grossly normal tubes and ovaries bilaterally. No intraabdominal adhesions were noted.  Procedure Details   The patient was taken to Operating Room, identified as the correct patient and the procedure verified as C-Section Delivery. A Time Out was held and the above information confirmed. After induction of anesthesia, the patient was draped and prepped in the usual sterile manner. A Pfannenstiel incision was made and carried down through the subcutaneous tissue to the fascia. Fascial incision was made and extended transversely with the Mayo scissors. The fascia was separated from the underlying rectus tissue superiorly and inferiorly. The peritoneum was identified and entered bluntly. Peritoneal incision was extended longitudinally. The utero-vesical peritoneal reflection was incised transversely and a bladder flap was created digitally.  A low transverse hysterotomy was made. The fetus was delivered atraumatically. The umbilical cord was clamped x2 and cut and the infant was handed to the awaiting pediatricians. The placenta was removed intact and appeared normal with a 3-vessel cord.  The uterus was exteriorized and cleared of all clot and debris. The hysterotomy was closed with  running sutures of 0 Vicryl suture. A second imbricating layer was placed with the same suture. Excellent hemostasis was observed. The uterus was returned to the abdomen. The pelvis was irrigated and again, excellent hemostasis was noted.  There was a 4cm pedunculated fibroid on the anterior surface of the uterus that was excised with suture placed along the stalk for hemostasis, followed by Interceed. The On Q Pain pump System was then placed.  Trocars were placed through the abdominal wall into the subfascial space and these were used to thread the silver soaker cathaters into place.The rectus fascia was then reapproximated with running sutures of Maxon, with careful placement not to incorporate the cathaters. Subcutaneous tissues are then irrigated with saline and hemostasis assured.  Skin is then closed with 4-0 vicryl suture in a subcuticular fashion followed by skin adhesive. The cathaters are flushed each with 5 mL of Bupivicaine and stabilized into place with dressing. Instrument, sponge, and needle counts were correct prior to the abdominal closure and at the conclusion of the case.  The patient tolerated the procedure well and was transferred to the recovery room in stable condition.   Barnett Applebaum, MD, [facility], [facility] 07/18/2018  4:12 AM

## 2018-07-18 NOTE — Anesthesia Preprocedure Evaluation (Signed)
Anesthesia Evaluation  Patient identified by MRN, date of birth, ID band Patient awake    Reviewed: Allergy & Precautions, H&P , NPO status , Patient's Chart, lab work & pertinent test results  Airway Mallampati: III  TM Distance: >3 FB Neck ROM: full    Dental  (+) Chipped   Pulmonary neg pulmonary ROS, former smoker,           Cardiovascular Exercise Tolerance: Good negative cardio ROS       Neuro/Psych PSYCHIATRIC DISORDERS Anxiety    GI/Hepatic negative GI ROS,   Endo/Other    Renal/GU   negative genitourinary   Musculoskeletal   Abdominal   Peds  Hematology negative hematology ROS (+)   Anesthesia Other Findings Past Medical History: No date: Anxiety No date: Dysmenorrhea No date: Fibroid No date: Pap smear abnormality of cervix with ASCUS favoring benign  Past Surgical History: No date: DILATION AND CURETTAGE OF UTERUS No date: HERNIA REPAIR     Comment:  Umbilical hernia  BMI    Body Mass Index:  36.84 kg/m      Reproductive/Obstetrics (+) Pregnancy                             Anesthesia Physical Anesthesia Plan  ASA: II  Anesthesia Plan: Spinal   Post-op Pain Management:    Induction:   PONV Risk Score and Plan:   Airway Management Planned:   Additional Equipment:   Intra-op Plan:   Post-operative Plan:   Informed Consent: I have reviewed the patients History and Physical, chart, labs and discussed the procedure including the risks, benefits and alternatives for the proposed anesthesia with the patient or authorized representative who has indicated his/her understanding and acceptance.     Plan Discussed with: Anesthesiologist and CRNA  Anesthesia Plan Comments:         Anesthesia Quick Evaluation

## 2018-07-18 NOTE — Progress Notes (Signed)
  OB Progress Note   38 y.o. [id number] @ [redacted]w[redacted]d , admitted for  Pregnancy, Labor Management after PPROM at 44 6/7 weeks yesterday  Subjective:  Pt feels no pain. She has now developed a pattern of decelerartion with a 2-3 decel w recovery occurring now about every hour despite positioning and hydration and O2  Objective:  BP 111/66   Pulse 93   Temp 97.8 F (36.6 C) (Oral)   Resp 16   Ht 5\' 1"  (1.549 m)   Wt 88.5 kg   LMP 12/07/2017 (Exact Date)   SpO2 95%   BMI 36.84 kg/m  Abd: gravid NT Extr: trace to 1+ bilateral pedal edema SVE: deferred  EFM: FHR: 150 bpm, variability: moderate,  accelerations:  Present,  decelerations:  Present as described, most have occured after a ctx but the last one was spontaneous Toco: irreg mild ctxs seen on monitor Labs: I have reviewed the patient's lab results.   Assessment & Plan:  [id number] @ [redacted]w[redacted]d, admitted for  Pregnancy and Labor/Delivery Management  1. Pain management: none. 2. FWB: FHT category 2.  3. ID: GBS negative 4. Labor management: Pt has been on magnesium, steroids, ABX for PPROM management, but due to multiple now decels the concern for fetal intolerence and placental insufficiency is present with need for delivery.  Plan CS.  The risks of cesarean section discussed with the patient included but were not limited to: bleeding which may require transfusion or reoperation; infection which may require antibiotics; injury to bowel, bladder, ureters or other surrounding organs; injury to the fetus; need for additional procedures including hysterectomy in the event of a life-threatening hemorrhage; placental abnormalities wth subsequent pregnancies, incisional problems, thromboembolic phenomenon and other postoperative/anesthesia complications. The patient concurred with the proposed plan, giving informed written consent for the procedure.   All discussed with patient, see orders  Barnett Applebaum, MD, [facility], [facility] 07/18/2018  2:13 AM

## 2018-07-18 NOTE — Transfer of Care (Signed)
Immediate Anesthesia Transfer of Care Note  Patient: [name]  Procedure(s) Performed: CESAREAN SECTION (N/A )  Patient Location: PACU  Anesthesia Type:Spinal  Level of Consciousness: awake, alert  and oriented  Airway & Oxygen Therapy: Patient Spontanous Breathing  Post-op Assessment: Report given to RN and Post -op Vital signs reviewed and stable  Post vital signs: Reviewed and stable  Last Vitals:  Vitals Value Taken Time  BP 113/53 07/18/2018  4:17 AM  Temp 36.4 C 07/18/2018  4:17 AM  Pulse 83 07/18/2018  4:17 AM  Resp 16 07/18/2018  4:17 AM  SpO2 99 % 07/18/2018  4:17 AM    Last Pain:  Vitals:   07/18/18 0417  TempSrc: Oral  PainSc: 0-No pain         Complications: No apparent anesthesia complications

## 2018-07-19 ENCOUNTER — Encounter: Payer: Self-pay | Admitting: Obstetrics & Gynecology

## 2018-07-19 ENCOUNTER — Telehealth: Payer: Self-pay | Admitting: Obstetrics & Gynecology

## 2018-07-19 LAB — RPR: RPR Ser Ql: NONREACTIVE

## 2018-07-19 MED ORDER — IBUPROFEN 600 MG PO TABS
600.0000 mg | ORAL_TABLET | Freq: Four times a day (QID) | ORAL | Status: DC
  Administered 2018-07-19 – 2018-07-21 (×9): 600 mg via ORAL
  Filled 2018-07-19 (×9): qty 1

## 2018-07-19 NOTE — Anesthesia Postprocedure Evaluation (Signed)
Anesthesia Post Note  Patient: Alexis Cordova  Procedure(s) Performed: CESAREAN SECTION (N/A )  Patient location during evaluation: Mother Baby Anesthesia Type: Spinal Level of consciousness: oriented and awake and alert Pain management: pain level controlled Vital Signs Assessment: post-procedure vital signs reviewed and stable Respiratory status: spontaneous breathing and respiratory function stable Cardiovascular status: blood pressure returned to baseline and stable Postop Assessment: no headache, no backache, no apparent nausea or vomiting and able to ambulate Anesthetic complications: no     Last Vitals:  Vitals:   07/19/18 0400 07/19/18 0740  BP:  106/68  Pulse: 87 78  Resp:  18  Temp:  36.7 C  SpO2: 97% 98%    Last Pain:  Vitals:   07/19/18 0740  TempSrc: Oral  PainSc:                  Alexis Cordova

## 2018-07-19 NOTE — Plan of Care (Signed)

## 2018-07-19 NOTE — Progress Notes (Signed)
  Subjective:   Post Op Day 1: Patient is doing well. She is ambulating and voiding without difficulty. She has just walked back to her room from the nursery. She is tolerating PO intake and her pain is well controlled with PO pain medications and On Q pump. She has been pumping to stimulate milk production.   Objective:  Blood pressure 106/68, pulse 78, temperature 98 F (36.7 C), temperature source Oral, resp. rate 18, height 5\' 1"  (1.549 m), weight 88.5 kg, last menstrual period 12/07/2017, SpO2 98%, currently breastfeeding.  General: NAD Pulmonary: no increased work of breathing Abdomen: non-distended, non-tender, fundus firm at level of umbilicus Incision: C/D/I, On Q pump intact Extremities: no edema, no erythema, no tenderness  Results for orders placed or performed during the hospital encounter of 07/17/18 (from the past 24 hour(s))  CBC     Status: Abnormal   Collection Time: 07/18/18 10:21 AM  Result Value Ref Range   WBC 13.2 (H) 4.0 - 10.5 K/uL   RBC 4.11 3.87 - 5.11 MIL/uL   Hemoglobin 12.0 12.0 - 15.0 g/dL   HCT 35.7 (L) 36.0 - 46.0 %   MCV 86.9 80.0 - 100.0 fL   MCH 29.2 26.0 - 34.0 pg   MCHC 33.6 30.0 - 36.0 g/dL   RDW 14.5 11.5 - 15.5 %   Platelets 246 150 - 400 K/uL   nRBC 0.0 0.0 - 0.2 %    Intake/Output Summary (Last 24 hours) at 07/19/2018 9371 Last data filed at 07/18/2018 2330 Gross per 24 hour  Intake 1274 ml  Output 951 ml  Net 323 ml      Assessment:   38 y.o. [id number] postoperativeday # 1   Plan:  1) Acute blood loss anemia - hemodynamically stable and asymptomatic - po ferrous sulfate  2) O positive, Rubella Immune, Varicella Immune  3) TDAP status: needs postpartum  4) Breast/Donor milk/Contraception: not discussed  5) Disposition: Continue routine post c/section care   Rod Can, CNM

## 2018-07-19 NOTE — Plan of Care (Signed)
  Problem: Pain Managment: Goal: General experience of comfort will improve Outcome: Adequate for Discharge   Problem: Safety: Goal: Ability to remain free from injury will improve Outcome: Adequate for Discharge   Problem: Skin Integrity: Goal: Risk for impaired skin integrity will decrease Outcome: Adequate for Discharge   Problem: Education: Goal: Knowledge of condition will improve Outcome: Adequate for Discharge

## 2018-07-19 NOTE — Lactation Note (Signed)
This note was copied from a baby's chart. Lactation Consultation Note  Patient Name: Boy Anastasia Tompson [id number] Date: 07/19/2018   Mom concerned that she has been trying to pump around every 3 hours, but is not getting anything in the bottle since I helped her yesterday evening.  Reviewed again that DEBP was not like baby at the breast and usually was not able to pump any or very little d/t the viscosity of colostrum right now.  Explained supply and demand and need to keep pumping for stimulation and if did get colostrum to take to [facility] in [facility] even if it was only a drop of 2.  Reviewed normal course of lactation and to not give up because when mature milk transitions in that she will be able to get large volumes.  Lactation name and number written on white board and encouraged to call with any questions, concerns or assistance.  Maternal Data    Feeding Feeding Type: Donor Breast Milk  LATCH Score                   Interventions    Lactation Tools Discussed/Used     Consult Status      Jarold Motto 07/19/2018, 7:24 PM

## 2018-07-19 NOTE — Anesthesia Post-op Follow-up Note (Signed)
  Anesthesia Pain Follow-up Note  Patient: Alexis Cordova  Day #: 1  Date of Follow-up: 07/19/2018 Time: 8:48 AM  Last Vitals:  Vitals:   07/19/18 0400 07/19/18 0740  BP:  106/68  Pulse: 87 78  Resp:  18  Temp:  36.7 C  SpO2: 97% 98%    Level of Consciousness: alert  Pain: mild   Side Effects:None  Catheter Site Exam:clean     Plan: D/C from anesthesia care at surgeon's request  Brantley Fling

## 2018-07-19 NOTE — Telephone Encounter (Signed)
I alled and left voice mail for patient to call back to confirm appointment has been cancel for Wednesday 07/21/17 and she schedule for post op per [facility] at 11:20 07/26/17

## 2018-07-19 NOTE — Telephone Encounter (Signed)
-----   Message from Gae Dry, MD sent at 07/18/2018 11:06 AM EST ----- Regarding: Appt Cancel appt this Wednesday and reschedule a POST OP appt w [facility] for next Monday

## 2018-07-19 NOTE — Progress Notes (Signed)
Continuous Pulse OX. D/C'd as per standing order.

## 2018-07-20 NOTE — Progress Notes (Signed)
Admit Date: 07/17/2018 Delivery Date: 07/18/2018 Today's Date: 07/20/2018  Subjective: Postpartum Day 2: Cesarean Delivery Patient reports tolerating PO, + flatus and no problems voiding.    Objective: Vital signs in last 24 hours: Temp:  [97.8 F (36.6 C)-98.3 F (36.8 C)] 98.1 F (36.7 C) (01/07 0718) Pulse Rate:  [83-91] 85 (01/07 0718) Resp:  [18] 18 (01/07 0718) BP: (122-124)/(60-80) 122/80 (01/07 0718) SpO2:  [100 %] 100 % (01/07 0718)  Physical Exam:  General: alert, cooperative and no distress Lochia: appropriate Uterine Fundus: firm Incision: healing well, no significant drainage, no dehiscence, no significant erythema DVT Evaluation: No evidence of DVT seen on physical exam. Negative Homan's sign.  Recent Labs    07/17/18 1313 07/18/18 1021  HGB 12.7 12.0  HCT 38.5 35.7*    Assessment/Plan: Status post Cesarean section. Doing well postoperatively.  Continue current care. Infant improving in SCN Breast pumping Anticipate D/C tomorrow  Alexis Cordova 07/20/2018, 9:15 AM

## 2018-07-20 NOTE — Lactation Note (Signed)
This note was copied from a baby's chart. Lactation Consultation Note  Patient Name: Alexis Cordova Date: 07/20/2018   spoke with mom earlier today about obtaining an electric breast pump through her insurance to use at home to pump breasts when she is d/c'd home tomorrow to continue to stimulate milk supply while her baby is in SCN.  She contacted her insurance co. And obtained durable medical suppliers who she will contact to obtain a breast pump.  I also contacted one of the companies and obtained a phone no. For her to call about a breast pump.  Depending when she will be able to have one mailed to her, I informed her that she may need to rent a breast pump from [place].      Maternal Data    Feeding Feeding Type: Donor Breast Milk  LATCH Score                   Interventions    Lactation Tools Discussed/Used Tools: 66F feeding tube / Syringe;Pump   Consult Status      Alexis Cordova 07/20/2018, 5:33 PM

## 2018-07-21 ENCOUNTER — Encounter: Payer: BLUE CROSS/BLUE SHIELD | Admitting: Obstetrics & Gynecology

## 2018-07-21 ENCOUNTER — Ambulatory Visit: Payer: BLUE CROSS/BLUE SHIELD

## 2018-07-21 LAB — SURGICAL PATHOLOGY

## 2018-07-21 MED ORDER — OXYCODONE-ACETAMINOPHEN 5-325 MG PO TABS: 1.0000 | ORAL_TABLET | Freq: Four times a day (QID) | ORAL | 0 refills | Status: DC | PRN

## 2018-07-21 NOTE — Lactation Note (Signed)
This note was copied from a baby's chart. Lactation Consultation Note  Patient Name: Alexis Cordova CLEXN'T Date: 07/21/2018     Maternal Data    Feeding    LATCH Score                   Interventions    Lactation Tools Discussed/Used     Consult Status  Mother states that she is still in the process of getting a breast pump from insurance and does not know when her pump will arrive. Mother is going to rent hospital pump until her pump arrives. Mother is consistently pumping and getting good amounts of breast milk.    [name] 7/0/0174, 3:52 PM

## 2018-07-21 NOTE — Plan of Care (Signed)
Vs stable; up ad lib; goes to SCN to see baby; uses breast pump independently; takes motrin and tylenol for pain control

## 2018-07-21 NOTE — Progress Notes (Signed)
Provided and reviewed discharge paperwork and prescriptions. Verified understanding by use of teach back method, pt verbalized understanding as well. Follow up appointment provided. Patient to be discharged home with husband to transport. Infant continues to be admitted to special care nursery. Taken to visitor entrance via wheelchair to go home.

## 2018-07-26 ENCOUNTER — Ambulatory Visit (INDEPENDENT_AMBULATORY_CARE_PROVIDER_SITE_OTHER): Payer: BLUE CROSS/BLUE SHIELD | Admitting: Obstetrics & Gynecology

## 2018-07-26 ENCOUNTER — Ambulatory Visit: Payer: Self-pay

## 2018-07-26 VITALS — BP 118/74 | Ht 61.0 in | Wt 184.0 lb

## 2018-07-26 DIAGNOSIS — Z98891 History of uterine scar from previous surgery: Secondary | ICD-10-CM

## 2018-07-26 NOTE — Progress Notes (Signed)
  Postoperative Follow-up Patient presents post op from CS for PPROM at 31 weeks and FITL, 1 week ago.  Subjective: Patient reports marked improvement in her preop symptoms. Eating a regular diet without difficulty. The patient is not having any pain.  Activity: sedentary. Patient reports additional symptom's since surgery of min lochia, no depression.  Infant improving in SCN.  Objective: BP 118/74   Ht 5\' 1"  (1.549 m)   Wt 184 lb (83.5 kg)   BMI 34.77 kg/m  Physical Exam Constitutional:      General: She is not in acute distress.    Appearance: She is well-developed.  Cardiovascular:     Rate and Rhythm: Normal rate.  Pulmonary:     Effort: Pulmonary effort is normal.  Abdominal:     General: There is no distension.     Palpations: Abdomen is soft.     Tenderness: There is no abdominal tenderness.     Comments: Incision Healing Well   Musculoskeletal: Normal range of motion.  Neurological:     Mental Status: She is alert and oriented to person, place, and time.     Cranial Nerves: No cranial nerve deficit.  Skin:    General: Skin is warm and dry.     Assessment: s/p :  cesarean section progressing well  Plan: Patient has done well after surgery with no apparent complications.  I have discussed the post-operative course to date, and the expected progress moving forward.  The patient understands what complications to be concerned about.  I will see the patient in routine follow up, or sooner if needed.    Activity plan: No heavy lifting.Marland Kitchen  Pelvic rest.  Unsure BC No s/sx PPD  Hoyt Koch 07/26/2018, 11:17 AM

## 2018-07-26 NOTE — Lactation Note (Signed)
This note was copied from a baby's chart. Lactation Consultation Note  Patient Name: Alexis Cordova Date: 07/26/2018   Spoke with mom when she came to visit with Freida Busman.  Mom has been unable to get pump through her insurance.  They gave her names of distributors near by but none of them carry breast pumps.  Her mom has decided to just purchase one for her.  Gave her information from [place] and encouraged her to go through them to try and get pump from insurance.  Mom is getting more milk than she was, but concerned that she is struggling to keep up with Mario's demands.  She admitted that she had not been consistent with pumping 8 times in 24 hours and definitely not every 3 hours.  She is pumping about an ounce each time, and Freida Busman is getting 36 ml at a feeding.  Mom given DEBP kit to keep at bedside.  Set up Symphony pump and assisted her in comfortable position.  Demonsrated massage and hand expression which FOB volunteered to assist.  He was asking about her taking Fenugreek.  Handouts given and discussed ways to increase her milk supply including warmth, massage, hand expression, skin to skin when visiting, diet, drinking plenty of fluids (especially water),  lactation cookies, medication, galactagogues, super pumping and power pumping.  FOB responded that he was already taking Moringa which was on the list of galactagogues so might see if that would help her.  Mom expressed 45 ml at this pumping and felt softer.  Stressed most important action she could take to increase her milk supply is to drain her breasts around every 2 to 3 hours day and night especially if she started using any of the methods we discussed to increase her milk supply.  Encouraged mom to call with any questions, concerns or if she needed assistance from lactation.       Maternal Data    Feeding    LATCH Score                   Interventions    Lactation Tools Discussed/Used     Consult  Status      Alexis Cordova 07/26/2018, 3:01 PM

## 2018-08-13 ENCOUNTER — Ambulatory Visit: Payer: Self-pay

## 2018-08-13 NOTE — Lactation Note (Signed)
This note was copied from a baby's chart. Lactation Consultation Note  Patient Name: Alexis Cordova [id number] Date: 08/13/2018 Reason for consult: Initial assessment   Maternal Data    Feeding Feeding Type: Breast Fed Nipple Type: Slow - flow(nipple shield used)  LATCH Score Latch: Repeated attempts needed to sustain latch, nipple held in mouth throughout feeding, stimulation needed to elicit sucking reflex.  Audible Swallowing: A few with stimulation  Type of Nipple: Everted at rest and after stimulation  Comfort (Breast/Nipple): Soft / non-tender  Hold (Positioning): Assistance needed to correctly position infant at breast and maintain latch.  LATCH Score: 7  Interventions Interventions: Assisted with latch;Support pillows(shield)  Lactation Tools Discussed/Used Tools: Nipple Shields Nipple shield size: 20   Consult Status  LC assisted Francee Gentile and mother with positioning for breastfeeding. Mother states this is her first time breastfeeding Alexis Cordova. He was initially placed in the football position using the nipple shield. Alexis Cordova would suckle a couple of times and look around while holding the nipple in his mouth. [place] tried breast compressions to assist in keeping his interest. Alexis Cordova was then changed to a cradle position on his left side, in which he seemed to do a little bit better with. He would lick, suckle a few times and then hold the nipple in mouth. As he stayed on the breast he seemed to get slightly better with sucking but is still trying to figure out how to breastfeed. [place] reassured mother that infant needs more practice and that he would get better with more time and practice. LC also encouraged mother to do lots of skin to skin with infant.    [name] 07/16/1115, 3:09 PM

## 2018-08-18 ENCOUNTER — Telehealth: Payer: Self-pay

## 2018-08-18 NOTE — Telephone Encounter (Signed)
Pt wants to know if her forms were faxed the 1st of January.  [phone]

## 2018-08-23 ENCOUNTER — Ambulatory Visit (INDEPENDENT_AMBULATORY_CARE_PROVIDER_SITE_OTHER): Payer: BLUE CROSS/BLUE SHIELD | Admitting: Obstetrics & Gynecology

## 2018-08-23 ENCOUNTER — Ambulatory Visit: Payer: Self-pay

## 2018-08-23 ENCOUNTER — Encounter: Payer: Self-pay | Admitting: Obstetrics & Gynecology

## 2018-08-23 VITALS — BP 110/70 | Ht 61.0 in | Wt 190.0 lb

## 2018-08-23 DIAGNOSIS — Z98891 History of uterine scar from previous surgery: Secondary | ICD-10-CM

## 2018-08-23 NOTE — Progress Notes (Signed)
  Follow-up Patient presents with history of CS for 31 week PPROM, FITL, 5 weeks ago. Patient reports incisional concern on the right pole of her incision, noted today, soreness and skin separation, no pus or blood.  No fever.  Objective: BP 110/70   Ht 5\' 1"  (1.549 m)   Wt 190 lb (86.2 kg)   BMI 35.90 kg/m  Physical Exam Constitutional:      General: She is not in acute distress.    Appearance: She is well-developed.  Cardiovascular:     Rate and Rhythm: Normal rate.  Pulmonary:     Effort: Pulmonary effort is normal.  Abdominal:     General: There is no distension.     Palpations: Abdomen is soft.     Tenderness: There is no abdominal tenderness.     Comments: Incision Healing Well, right side w small area of stitch granuloma, min skin disruption and drainage, deep separation or dehiscence, mass   Musculoskeletal: Normal range of motion.  Neurological:     Mental Status: She is alert and oriented to person, place, and time.     Cranial Nerves: No cranial nerve deficit.  Skin:    General: Skin is warm and dry.   Assessment: 1. Wound care concern for prior cesarean section   Plan: No need for further care to incision.  Keep clean and dry.  No signs of cellulitis, infection, disruption, dehiscence  Hoyt Koch 08/23/2018, 9:42 AM

## 2018-08-23 NOTE — Lactation Note (Signed)
This note was copied from a baby's chart. Lactation Consultation Note  Patient Name: Alexis Cordova [id number] Date: 08/23/2018   Mom in to visit today.  Mom is barely making enough breast milk to keep up with MJ's demands.  Pumped while she was here and got 65 ml.  MJ is taking 65 to 80 ml at a feeding thickened with oatmeal.  Mom has no extra breast milk stored at home.  Mom is going to try to get back tonight to bring some more breast milk so MJ will not have to get formula.  Mom has her own pump through her [employer].  Maternal Data    Feeding Feeding Type: Breast Milk with Formula added Nipple Type: Dr. Roosvelt Harps level 4  LATCH Score                   Interventions    Lactation Tools Discussed/Used Tools: Bottle   Consult Status      Jarold Motto 08/23/2018, 7:29 PM

## 2018-08-30 ENCOUNTER — Other Ambulatory Visit (HOSPITAL_COMMUNITY)
Admission: RE | Admit: 2018-08-30 | Discharge: 2018-08-30 | Disposition: A | Discharge status: Home / Self Care | Payer: BLUE CROSS/BLUE SHIELD | Source: Ambulatory Visit | Attending: Obstetrics & Gynecology | Admitting: Obstetrics & Gynecology

## 2018-08-30 ENCOUNTER — Ambulatory Visit (INDEPENDENT_AMBULATORY_CARE_PROVIDER_SITE_OTHER): Payer: BLUE CROSS/BLUE SHIELD | Admitting: Obstetrics & Gynecology

## 2018-08-30 ENCOUNTER — Encounter: Payer: Self-pay | Admitting: Obstetrics & Gynecology

## 2018-08-30 VITALS — BP 120/80 | Ht 61.0 in | Wt 192.0 lb

## 2018-08-30 DIAGNOSIS — Z124 Encounter for screening for malignant neoplasm of cervix: Secondary | ICD-10-CM | POA: Insufficient documentation

## 2018-08-30 DIAGNOSIS — Z98891 History of uterine scar from previous surgery: Secondary | ICD-10-CM

## 2018-08-30 MED ORDER — DROSPIRENONE 4 MG PO TABS: 1.0000 | ORAL_TABLET | Freq: Every day | ORAL | 12 refills | Status: DC

## 2018-08-30 NOTE — Patient Instructions (Signed)
Drospirenone tablets (contraception) What is this medicine? DROSPIRENONE (dro SPY re nown) is an oral contraceptive (birth control pill). The product contains a female hormone known as a progestin. It is used to prevent pregnancy. This medicine may be used for other purposes; ask your health care provider or pharmacist if you have questions. COMMON BRAND NAME(S): SLYND What should I tell my health care provider before I take this medicine? They need to know if you have any of these conditions: -abnormal vaginal bleeding -adrenal gland disease -blood vessel disease or blood clots -breast, cervical, endometrial, ovarian, liver, or uterine cancer -diabetes -heart disease or recent heart attack -high potassium level -kidney disease -liver disease -mental depression -migraine headaches -stroke -an unusual or allergic reaction to drospirenone, progestins, or other medicines, foods, dyes, or preservatives -pregnant or trying to get pregnant -breast-feeding How should I use this medicine? Take this medicine by mouth. To reduce nausea, this medicine may be taken with food. Follow the directions on the prescription label. Take this medicine at the same time each day and in the order directed on the package. Do not take your medicine more often than directed. A patient package insert for the product will be given with each prescription and refill. Read this sheet carefully each time. The sheet may change frequently. Talk to your pediatrician regarding the use of this medicine in children. Special care may be needed. This medicine has been used in female children who have started having menstrual periods. Overdosage: If you think you have taken too much of this medicine contact a poison control center or emergency room at once. NOTE: This medicine is only for you. Do not share this medicine with others. What if I miss a dose? If you miss a dose, take it as soon as you can and refer to the patient  information sheet you received with your medicine for direction. If you miss more than one pill, this medicine may not be as effective and you may need to use another form of birth control. What may interact with this medicine? Do not take this medicine with any of the following medications: -atazanavir; cobicistat -bosentan -fosamprenavir This medicine may also interact with the following medications: -aprepitant -barbiturates like phenobarbital, primidone -carbamazepine -certain antibiotics like clarithromycin, rifampin, rifabutin, rifapentine -certain antivirals for HIV or hepatitis -certain diuretics like amiloride, spironolactone, triamterene -certain medicines for fungal infections like griseofulvin, ketoconazole, itraconazole, voriconazole -certain medicines for blood pressure, heart disease -cyclosporine -felbamate -heparin -medicines for diabetes -modafinil -NSAIDs, medicines for pain and inflammation, like ibuprofen or naproxen -oxcarbazepine -phenytoin -potassium supplements -rufinamide -St. John's wort -topiramate This list may not describe all possible interactions. Give your health care provider a list of all the medicines, herbs, non-prescription drugs, or dietary supplements you use. Also tell them if you smoke, drink alcohol, or use illegal drugs. Some items may interact with your medicine. What should I watch for while using this medicine? Visit your doctor or health care professional for regular checks on your progress. You will need a regular breast and pelvic exam and Pap smear while on this medicine. You may need blood work done while you are taking this medicine. If you have any reason to think you are pregnant, stop taking this medicine right away and contact your doctor or health care professional. This medicine does not protect you against HIV infection (AIDS) or any other sexually transmitted diseases. If you are going to have elective surgery, you may need  to stop taking this medicine before  the surgery. Consult your health care professional for advice. What side effects may I notice from receiving this medicine? Side effects that you should report to your doctor or health care professional as soon as possible: -allergic reactions like skin rash, itching or hives, swelling of the face, lips, or tongue -breast tissue changes or discharge -depressed mood -severe pain, swelling, or tenderness in the abdomen -signs and symptoms of a blood clot such as chest pain; shortness of breath; pain, swelling, or warmth in the leg -signs and symptoms of increased potassium like muscle weakness; chest pain; or fast, irregular heartbeat -signs and symptoms of liver injury like dark yellow or brown urine; general ill feeling or flu-like symptoms; light-colored stools; loss of appetite; nausea; right upper belly pain; unusually weak or tired; yellowing of the eyes or skin -signs and symptoms of a stroke like changes in vision; confusion; trouble speaking or understanding; severe headaches; sudden numbness or weakness of the face, arm or leg; trouble walking; dizziness; loss of balance or coordination -unusual vaginal bleeding -unusually weak or tired Side effects that usually do not require medical attention (report these to your doctor or health care professional if they continue or are bothersome): -acne -breast tenderness -headache -menstrual cramps -nausea -weight gain This list may not describe all possible side effects. Call your doctor for medical advice about side effects. You may report side effects to FDA at 1-800-FDA-1088. Where should I keep my medicine? Keep out of the reach of children. Store at room temperature between 20 and 25 degrees C (68 and 77 degrees F). Throw away any unused medicine after the expiration date. NOTE: This sheet is a summary. It may not cover all possible information. If you have questions about this medicine, talk to your  doctor, pharmacist, or health care provider.  2019 Elsevier/Gold Standard (2017-12-09 15:01:56)

## 2018-08-30 NOTE — Progress Notes (Signed)
  OBSTETRICS POSTPARTUM CLINIC PROGRESS NOTE  Subjective:     Alexis Cordova is a 38 y.o. [phone] female who presents for a postpartum visit. She is 6 weeks postpartum following a Preterm pregnancy <37 weeks and delivery by C-section.  I have fully reviewed the prenatal and intrapartum course. Anesthesia: spinal.  Postpartum course has been complicated by uncomplicated.  Baby is feeding by Breast. Also supplementing at [facility]. Bleeding: patient has not  resumed menses.  Bowel function is normal. Bladder function is normal.  Patient is not sexually active. Contraception method desired is oral progesterone-only contraceptive.  Postpartum depression screening: negative. Edinburgh 0.  The following portions of the patient's history were reviewed and updated as appropriate: allergies, current medications, past family history, past medical history, past social history, past surgical history and problem list.  Review of Systems Pertinent items are noted in HPI.  Objective:    BP 120/80   Ht 5\' 1"  (1.549 m)   Wt 192 lb (87.1 kg)   BMI 36.28 kg/m   General:  alert and no distress   Breasts:  inspection negative, no nipple discharge or bleeding, no masses or nodularity palpable  Lungs: clear to auscultation bilaterally  Heart:  regular rate and rhythm, S1, S2 normal, no murmur, click, rub or gallop  Abdomen: soft, non-tender; bowel sounds normal; no masses,  no organomegaly.  Well healed Pfannenstiel incision   Vulva:  normal  Vagina: normal vagina, no discharge, exudate, lesion, or erythema  Cervix:  no cervical motion tenderness and no lesions  Corpus: normal size, contour, position, consistency, mobility, non-tender  Adnexa:  normal adnexa and no mass, fullness, tenderness  Rectal Exam: Not performed.          Assessment:  Post Partum Care visit 1. Status post cesarean section  2. Screening for cervical cancer - Cytology - PAP   Plan:  See orders and Patient  Instructions Contraceptive counseling for oral progesterone-only contraceptive Slynd; change to reg OCP when stops lactating Resume all normal activities Follow up in: 12 months or as needed.   Barnett Applebaum, MD, [facility], [facility] 08/30/2018  10:25 AM

## 2018-08-31 LAB — CYTOLOGY - PAP
Diagnosis: NEGATIVE
HPV (WINDOPATH): NOT DETECTED

## 2018-09-01 ENCOUNTER — Telehealth: Payer: Self-pay

## 2018-09-01 NOTE — Telephone Encounter (Signed)
FMLA/DISABILITY form for [facility] for [employer] filled out, signature obtained, and given to [place] for processing.

## 2018-09-07 ENCOUNTER — Ambulatory Visit: Payer: Self-pay

## 2018-09-07 NOTE — Lactation Note (Signed)
This note was copied from a baby's chart. Lactation Consultation Note  Patient Name: Alexis Cordova. Today's Date: 09/07/2018     Maternal Data    Feeding    LATCH Score                   Interventions    Lactation Tools Discussed/Used     Consult Status  Alexis Dales "MJ" is here along with his parents for a lactation outpatient consultation. Mother states that she is interested in putting MJ to breast. MJ was discharged from [facility] last week after a 6 week stay. MJ was changed and breastfeeding was attempted using the nipple shield. MJ was getting frustrated due to the slower flow of milk coming from breast so SNS device was used with no success. LC attempted to syringe feed infant while using the nipple shield and it seemed to work for a little while until infant became frustrated again. After trying at the breast for a few minutes, MJ was given a bottle and drank 3-4 oz. LC explained to mother that getting MJ to breast is going to be a process that will take time and patience. Mother states that it took MJ 3 weeks to be proficient with drinking milk from a bottle. She also states that breastfeeding is something that she really desires and is willing to be dedicated to the process. LC made a plan for mother and MJ to attend the [facility] breastfeeding support group so that we can practice more with getting MJ more comfortable at the breast. Mother is comfortable with this plan.    [name] 03/10/33, 12:33 PM

## 2019-06-06 ENCOUNTER — Ambulatory Visit (INDEPENDENT_AMBULATORY_CARE_PROVIDER_SITE_OTHER): Payer: 59 | Admitting: Obstetrics & Gynecology

## 2019-06-06 ENCOUNTER — Other Ambulatory Visit: Payer: Self-pay

## 2019-06-06 ENCOUNTER — Encounter: Payer: Self-pay | Admitting: Obstetrics & Gynecology

## 2019-06-06 VITALS — BP 130/80 | Temp 97.4°F | Ht 61.0 in | Wt 212.0 lb

## 2019-06-06 DIAGNOSIS — O209 Hemorrhage in early pregnancy, unspecified: Secondary | ICD-10-CM | POA: Diagnosis not present

## 2019-06-06 NOTE — Progress Notes (Signed)
Obstetric Problem Visit   Chief Complaint:  Chief Complaint  Patient presents with  . Threatened Miscarriage    History of Present Illness: Patient is a 38 y.o. [id number] Unknown presenting for first trimester bleeding.  The onset of bleeding was last week, after uCG Pos 05/19/2019.  She has one period since stopping progesterone Slynd and breast feeding in Sept.  Is bleeding equal to or greater than normal menstrual flow:  Yes Any recent trauma:  No Recent intercourse:  No History of prior miscarriage:  Yes Prior ultrasound demonstrating IUP:  No Prior ultrasound demonstrating viable IUP:  No Prior Serum HCG:  No Rh status: +  CS 10 mos ago after PPROM. H/o recurent miscarriages  PMHx: She  has a past medical history of Anxiety, Dysmenorrhea, Fibroid, and Pap smear abnormality of cervix with ASCUS favoring benign. Also,  has a past surgical history that includes Hernia repair; Dilation and curettage of uterus; and Cesarean section (N/A, 07/18/2018)., family history is not on file.,  reports that she has quit smoking. She has never used smokeless tobacco. She reports that she does not drink alcohol or use drugs.  She has a current medication list which includes the following prescription(s): drospirenone and prenatal multivitamin. Also, is allergic to shellfish allergy.  Review of Systems  Constitutional: Negative for chills, fever and malaise/fatigue.  HENT: Negative for congestion, sinus pain and sore throat.   Eyes: Negative for blurred vision and pain.  Respiratory: Negative for cough and wheezing.   Cardiovascular: Negative for chest pain and leg swelling.  Gastrointestinal: Negative for abdominal pain, constipation, diarrhea, heartburn, nausea and vomiting.  Genitourinary: Negative for dysuria, frequency, hematuria and urgency.  Musculoskeletal: Negative for back pain, joint pain, myalgias and neck pain.  Skin: Negative for itching and rash.  Neurological: Negative for  dizziness, tremors and weakness.  Endo/Heme/Allergies: Does not bruise/bleed easily.  Psychiatric/Behavioral: Negative for depression. The patient is not nervous/anxious and does not have insomnia.     Objective: Vitals:   06/06/19 1423  BP: 130/80  Temp: (!) 97.4 F (36.3 C)   Physical Exam Constitutional:      General: She is not in acute distress.    Appearance: She is well-developed.  Musculoskeletal: Normal range of motion.  Neurological:     Mental Status: She is alert and oriented to person, place, and time.  Skin:    General: Skin is warm and dry.  Vitals signs reviewed.     Assessment: 38 y.o. [id number] Unknown 1. First trimester bleeding - Beta HCG, Quant   Plan: 1) First trimester bleeding - incidence and clinical course of first trimester bleeding is discussed in detail with the patient today.  Approximately 1/3 of pregnancies ending in live births experienced 1st trimester bleeding.  The amount of bleeding is variable and not necessarily predictive of outcome.  Sources may be cervical or uterine.  Subchorionic hemorrhages are a frequent concurrent findings on ultrasound and are followed expectantly.  These often absorb or regress spontaneously although risk for expansion and further disruption of the utero-placental interface leading to miscarriage is possible.  There is no clearly documented benefit to limiting or modifying activity and sexual intercourse in altering clinic course of 1st trimester bleeding.    2) If not already done will proceed with TVUS evaluation to document viability (will await results of Beta first), and if uncertain viability or absence of a demonstrable IUP (and no previous documentation of IUP) will trend HCG levels.  3) The patient is  Rh POS, rhogam is therefore not indicated to decrease the risk rhesus alloimmunization.    4) Routine bleeding precautions were discussed with the patient prior the conclusion of today's visit.  5) Desires  birth control if this is a miscarriage  A total of 15 minutes were spent face-to-face with the patient during this encounter and over half of that time dealt with counseling and coordination of care.  Barnett Applebaum, MD, [facility], [facility] 06/06/2019  2:50 PM

## 2019-06-07 ENCOUNTER — Other Ambulatory Visit: Payer: Self-pay | Admitting: Obstetrics & Gynecology

## 2019-06-07 DIAGNOSIS — O209 Hemorrhage in early pregnancy, unspecified: Secondary | ICD-10-CM

## 2019-06-07 LAB — BETA HCG QUANT (REF LAB): hCG Quant: 338 m[IU]/mL

## 2019-06-07 NOTE — Progress Notes (Signed)
Labs d/w pt.  She will return Wednesday am for stat beta hCG lab. (lab only visit). Order placed.

## 2019-06-08 ENCOUNTER — Other Ambulatory Visit: Payer: Self-pay

## 2019-06-08 ENCOUNTER — Other Ambulatory Visit: Payer: 59

## 2019-06-08 DIAGNOSIS — O209 Hemorrhage in early pregnancy, unspecified: Secondary | ICD-10-CM | POA: Diagnosis not present

## 2019-06-09 LAB — BETA HCG QUANT (REF LAB): hCG Quant: 108 m[IU]/mL

## 2019-07-03 DIAGNOSIS — Z20828 Contact with and (suspected) exposure to other viral communicable diseases: Secondary | ICD-10-CM | POA: Diagnosis not present

## 2019-11-22 NOTE — Progress Notes (Signed)
Scheduled to complete physical 12/01/19 with Dr. Jimmye Norman.  AMD

## 2019-11-23 ENCOUNTER — Ambulatory Visit: Payer: 59

## 2019-11-23 ENCOUNTER — Other Ambulatory Visit: Payer: Self-pay

## 2019-11-23 DIAGNOSIS — Z0000 Encounter for general adult medical examination without abnormal findings: Secondary | ICD-10-CM

## 2019-11-23 LAB — POCT URINALYSIS DIPSTICK
Bilirubin, UA: NEGATIVE
Glucose, UA: NEGATIVE
Ketones, UA: NEGATIVE
Leukocytes, UA: NEGATIVE
Nitrite, UA: NEGATIVE
Protein, UA: POSITIVE — AB
Spec Grav, UA: >=1.03 — AB (ref 1.010–1.025)
Urobilinogen, UA: 0.2 E.U./dL
pH, UA: 5.5 (ref 5.0–8.0)

## 2019-11-24 LAB — CMP12+LP+TP+TSH+6AC+CBC/D/PLT
ALT: 28 IU/L (ref 0–32)
AST: 27 IU/L (ref 0–40)
Albumin/Globulin Ratio: 1.5 (ref 1.2–2.2)
Albumin: 4.5 g/dL (ref 3.8–4.8)
Alkaline Phosphatase: 70 IU/L (ref 39–117)
BUN/Creatinine Ratio: 14 (ref 9–23)
BUN: 10 mg/dL (ref 6–20)
Basophils Absolute: 0 10*3/uL (ref 0.0–0.2)
Basos: 0 %
Bilirubin Total: <0.2 mg/dL (ref 0.0–1.2)
Calcium: 9.1 mg/dL (ref 8.7–10.2)
Chloride: 104 mmol/L (ref 96–106)
Chol/HDL Ratio: 5.1 ratio — ABNORMAL HIGH (ref 0.0–4.4)
Cholesterol, Total: 213 mg/dL — ABNORMAL HIGH (ref 100–199)
Creatinine, Ser: 0.69 mg/dL (ref 0.57–1.00)
EOS (ABSOLUTE): 0.1 10*3/uL (ref 0.0–0.4)
Eos: 2 %
Estimated CHD Risk: 1.3 times avg. — ABNORMAL HIGH (ref 0.0–1.0)
Free Thyroxine Index: 2.1 (ref 1.2–4.9)
GFR calc Af Amer: 129 mL/min/{1.73_m2} (ref >59)
GFR calc non Af Amer: 112 mL/min/{1.73_m2} (ref >59)
GGT: 29 IU/L (ref 0–60)
Globulin, Total: 3 g/dL (ref 1.5–4.5)
Glucose: 110 mg/dL — ABNORMAL HIGH (ref 65–99)
HDL: 42 mg/dL (ref >39)
Hematocrit: 40 % (ref 34.0–46.6)
Hemoglobin: 13.1 g/dL (ref 11.1–15.9)
Immature Grans (Abs): 0 10*3/uL (ref 0.0–0.1)
Immature Granulocytes: 0 %
Iron: 37 ug/dL (ref 27–159)
LDH: 192 IU/L (ref 119–226)
LDL Chol Calc (NIH): 149 mg/dL — ABNORMAL HIGH (ref 0–99)
Lymphocytes Absolute: 2.3 10*3/uL (ref 0.7–3.1)
Lymphs: 40 %
MCH: 27.3 pg (ref 26.6–33.0)
MCHC: 32.8 g/dL (ref 31.5–35.7)
MCV: 83 fL (ref 79–97)
Monocytes Absolute: 0.4 10*3/uL (ref 0.1–0.9)
Monocytes: 7 %
Neutrophils Absolute: 2.9 10*3/uL (ref 1.4–7.0)
Neutrophils: 51 %
Phosphorus: 3.2 mg/dL (ref 3.0–4.3)
Platelets: 354 10*3/uL (ref 150–450)
Potassium: 4.5 mmol/L (ref 3.5–5.2)
RBC: 4.8 x10E6/uL (ref 3.77–5.28)
RDW: 14.3 % (ref 11.7–15.4)
Sodium: 137 mmol/L (ref 134–144)
T3 Uptake Ratio: 31 % (ref 24–39)
T4, Total: 6.7 ug/dL (ref 4.5–12.0)
TSH: 1.2 u[IU]/mL (ref 0.450–4.500)
Total Protein: 7.5 g/dL (ref 6.0–8.5)
Triglycerides: 122 mg/dL (ref 0–149)
Uric Acid: 4.6 mg/dL (ref 2.6–6.2)
VLDL Cholesterol Cal: 22 mg/dL (ref 5–40)
WBC: 5.8 10*3/uL (ref 3.4–10.8)

## 2019-12-01 ENCOUNTER — Other Ambulatory Visit: Payer: Self-pay

## 2019-12-01 ENCOUNTER — Encounter: Payer: Self-pay | Admitting: Emergency Medicine

## 2019-12-01 ENCOUNTER — Ambulatory Visit: Payer: Self-pay | Admitting: Emergency Medicine

## 2019-12-01 VITALS — BP 143/85 | HR 90 | Temp 98.2°F | Resp 12 | Ht 61.0 in | Wt 203.0 lb

## 2019-12-01 DIAGNOSIS — Z0000 Encounter for general adult medical examination without abnormal findings: Secondary | ICD-10-CM

## 2019-12-01 MED ORDER — EPINEPHRINE 0.3 MG/0.3ML IJ SOAJ: 0.3000 mg | INTRAMUSCULAR | 1 refills | Status: DC | PRN

## 2019-12-01 NOTE — Progress Notes (Signed)
[facility] Provider Note       Time seen: 1:34 PM    I have reviewed the vital signs and the nursing notes.  HISTORY   Chief Complaint Annual Exam   HPI Alexis Cordova is a 39 y.o. female with a history of anxiety, dysmenorrhea, fibroids who presents today for annual physical examination.  Patient denies any complaints.  Has been doing well, was sick with Covid last year but has had no sequela.  Past Medical History:  Diagnosis Date  . Anxiety   . Dysmenorrhea   . Fibroid   . Pap smear abnormality of cervix with ASCUS favoring benign     Past Surgical History:  Procedure Laterality Date  . CESAREAN SECTION N/A 07/18/2018   Procedure: CESAREAN SECTION;  Surgeon: Gae Dry, MD;  Location: [facility] ORS;  Service: Obstetrics;  Laterality: N/A;  . DILATION AND CURETTAGE OF UTERUS    . HERNIA REPAIR     Umbilical hernia  . KNEE ARTHROSCOPY W/ MENISCAL REPAIR Left     Allergies Shellfish allergy  Review of Systems Constitutional: Negative for fever. Cardiovascular: Negative for chest pain. Respiratory: Negative for shortness of breath. Gastrointestinal: Negative for abdominal pain, vomiting and diarrhea. Musculoskeletal: Negative for back pain. Skin: Negative for rash. Neurological: Negative for headaches, focal weakness or numbness.  All systems negative/normal/unremarkable except as stated in the HPI  ____________________________________________   PHYSICAL EXAM:  VITAL SIGNS: Vitals:   12/01/19 1314  BP: (!) 143/85  Pulse: 90  Resp: 12  Temp: 98.2 F (36.8 C)  SpO2: 98%    Constitutional: Alert and oriented. Well appearing and in no distress. Eyes: Conjunctivae are normal. Normal extraocular movements. ENT      Head: Normocephalic and atraumatic.      Nose: No congestion/rhinnorhea.      Mouth/Throat: Mucous membranes are moist.      Neck: No stridor. Cardiovascular: Normal rate, regular rhythm. No murmurs,  rubs, or gallops. Respiratory: Normal respiratory effort without tachypnea nor retractions. Breath sounds are clear and equal bilaterally. No wheezes/rales/rhonchi. Gastrointestinal: Soft and nontender. Normal bowel sounds Musculoskeletal: Nontender with normal range of motion in extremities. No lower extremity tenderness nor edema. Neurologic:  Normal speech and language. No gross focal neurologic deficits are appreciated.  Skin:  Skin is warm, dry and intact. No rash noted. Psychiatric: Speech and behavior are normal.  ____________________________________________   LABS (pertinent positives/negatives)  Recent Results (from the past 2160 hour(s))  CMP12+LP+TP+TSH+6AC+CBC/D/Plt     Status: Abnormal   Collection Time: 11/23/19  8:54 AM  Result Value Ref Range   Glucose 110 (H) 65 - 99 mg/dL   Uric Acid 4.6 2.6 - 6.2 mg/dL    Comment:            Therapeutic target for gout patients: <6.0   BUN 10 6 - 20 mg/dL   Creatinine, Ser 0.69 0.57 - 1.00 mg/dL   GFR calc non Af Amer 112 >59 mL/min/1.73   GFR calc Af Amer 129 >59 mL/min/1.73    Comment: **[facility] currently reports eGFR in compliance with the current**   recommendations of the [employer]. [facility] will   update reporting as new guidelines are published from the NKF-ASN   Task force.    BUN/Creatinine Ratio 14 9 - 23   Sodium 137 134 - 144 mmol/L   Potassium 4.5 3.5 - 5.2 mmol/L   Chloride 104 96 - 106 mmol/L   Calcium 9.1 8.7 - 10.2  mg/dL   Phosphorus 3.2 3.0 - 4.3 mg/dL   Total Protein 7.5 6.0 - 8.5 g/dL   Albumin 4.5 3.8 - 4.8 g/dL   Globulin, Total 3.0 1.5 - 4.5 g/dL   Albumin/Globulin Ratio 1.5 1.2 - 2.2   Bilirubin Total <0.2 0.0 - 1.2 mg/dL   Alkaline Phosphatase 70 39 - 117 IU/L   LDH 192 119 - 226 IU/L   AST 27 0 - 40 IU/L   ALT 28 0 - 32 IU/L   GGT 29 0 - 60 IU/L   Iron 37 27 - 159 ug/dL   Cholesterol, Total 213 (H) 100 - 199 mg/dL   Triglycerides 122 0 - 149 mg/dL   HDL 42 >39 mg/dL   VLDL  Cholesterol Cal 22 5 - 40 mg/dL   LDL Chol Calc (NIH) 149 (H) 0 - 99 mg/dL   Chol/HDL Ratio 5.1 (H) 0.0 - 4.4 ratio    Comment:                                   T. Chol/HDL Ratio                                             Men  Women                               1/2 Avg.Risk  3.4    3.3                                   Avg.Risk  5.0    4.4                                2X Avg.Risk  9.6    7.1                                3X Avg.Risk 23.4   11.0    Estimated CHD Risk 1.3 (H) 0.0 - 1.0 times avg.    Comment: The CHD Risk is based on the T. Chol/HDL ratio. Other factors affect CHD Risk such as hypertension, smoking, diabetes, severe obesity, and family history of premature CHD.    TSH 1.200 0.450 - 4.500 uIU/mL   T4, Total 6.7 4.5 - 12.0 ug/dL   T3 Uptake Ratio 31 24 - 39 %   Free Thyroxine Index 2.1 1.2 - 4.9   WBC 5.8 3.4 - 10.8 x10E3/uL   RBC 4.80 3.77 - 5.28 x10E6/uL   Hemoglobin 13.1 11.1 - 15.9 g/dL   Hematocrit 40.0 34.0 - 46.6 %   MCV 83 79 - 97 fL   MCH 27.3 26.6 - 33.0 pg   MCHC 32.8 31.5 - 35.7 g/dL   RDW 14.3 11.7 - 15.4 %   Platelets 354 150 - 450 x10E3/uL   Neutrophils 51 Not Estab. %   Lymphs 40 Not Estab. %   Monocytes 7 Not Estab. %   Eos 2 Not Estab. %   Basos 0 Not Estab. %   Neutrophils Absolute 2.9 1.4 - 7.0 x10E3/uL  Lymphocytes Absolute 2.3 0.7 - 3.1 x10E3/uL   Monocytes Absolute 0.4 0.1 - 0.9 x10E3/uL   EOS (ABSOLUTE) 0.1 0.0 - 0.4 x10E3/uL   Basophils Absolute 0.0 0.0 - 0.2 x10E3/uL   Immature Granulocytes 0 Not Estab. %   Immature Grans (Abs) 0.0 0.0 - 0.1 x10E3/uL  POCT urinalysis dipstick     Status: Abnormal   Collection Time: 11/23/19  9:35 AM  Result Value Ref Range   Color, UA Dark Yellow    Clarity, UA Clear    Glucose, UA Negative Negative   Bilirubin, UA Negative    Ketones, UA Negative    Spec Grav, UA >=1.030 (A) 1.010 - 1.025   Blood, UA Postive     Comment: 1+   pH, UA 5.5 5.0 - 8.0   Protein, UA Positive (A) Negative     Comment: 1+   Urobilinogen, UA 0.2 0.2 or 1.0 E.U./dL   Nitrite, UA Negative    Leukocytes, UA Negative Negative   Appearance     Odor      DIFFERENTIAL DIAGNOSIS  Annual physical examination, hyperlipidemia  ASSESSMENT AND PLAN  Annual physical examination   Plan: The patient had presented for annual physical examination without complaints. Patient's labs do indicate mild hyperlipidemia which she has had in the past.  Rather than start her on medications we will encouraged diet and exercise and recheck.  Overall she needs a refill of her EpiPen.  She is cleared for follow-up as needed.  Lenise Arena MD    Note: This note was generated in part or whole with voice recognition software. Voice recognition is usually quite accurate but there are transcription errors that can and very often do occur. I apologize for any typographical errors that were not detected and corrected.

## 2019-12-01 NOTE — Progress Notes (Signed)
Requesting Rx refill for EpiPen.  States the ones she has are expired.  AMD

## 2020-06-08 IMAGING — US US MFM OB FOLLOW-UP
1 series · 12 of 28 positions shown · non-contrast
Comparison: none

PATIENT INFO:

PERFORMED BY:
                   Sonographer
SERVICE(S) PROVIDED:
 ----------------------------------------------------------------------
INDICATIONS:
  26 weeks gestation of pregnancy
FETAL EVALUATION:
 Num Of Fetuses:         1
 Fetal Heart Rate(bpm):  165
 Cardiac Activity:       Present
 Presentation:           Cephalic
 Placenta:               Anterior
 AFI Sum(cm)     %Tile       Largest Pocket(cm)
 17.95           69
 RUQ(cm)       RLQ(cm)       LUQ(cm)        LLQ(cm)
BIOMETRY:
 BPD:      62.7  mm     G. Age:  25w 3d         21  %    CI:        74.82   %    70 - 86
                                                         FL/HC:      21.2   %    18.6 -
 HC:       230   mm     G. Age:  25w 0d          6  %    HC/AC:      1.05        1.04 -
 AC:      218.8  mm     G. Age:  26w 3d         52  %    FL/BPD:     77.7   %    71 - 87
 FL:       48.7  mm     G. Age:  26w 3d         48  %    FL/AC:      22.3   %    20 - 24
 HUM:      45.3  mm     G. Age:  26w 6d         65  %
 Est. FW:     900  gm           2 lb     47  %
GESTATIONAL AGE:
 LMP:           26w 0d        Date:  12/07/17                 EDD:   09/13/18
 U/S Today:     25w 6d                                        EDD:   09/14/18
 Best:          26w 0d     Det. By:  LMP  (12/07/17)          EDD:   09/13/18
ANATOMY:
 Cavum:                 Visualized             Ductal Arch:            Normal appearance
                        previously
 Ventricles:            Normal appearance      Stomach:                Seen
 Cerebellum:            Visualized             Abdominal Wall:         Visualized
                        previously                                     previously
 Posterior Fossa:       Visualized             Cord Vessels:           3 vessels,
                        previously                                     visualized previously
 Face:                  Orbits visualized      Kidneys:                Normal appearance
 Lips:                  Visualized             Bladder:                Seen
 Heart:                 4-Chamber view         Spine:                  Visualized
                        appears normal                                 previously
 RVOT:                  Normal appearance      Upper Extremities:      Visualized
                                                                       previously
 LVOT:                  Normal appearance      Lower Extremities:      Visualized
 Aortic Arch:           Normal appearance
CERVIX UTERUS ADNEXA:
 Cervix
 Length:           3.64  cm.
MYOMAS:
  Site                     L(cm)      W(cm)      D(cm)      Location
  Anterior Left Lateral
  Anterior Fundal          5
  Posterior Left Lateral   5.4        3          5
  Posterior
  Blood Flow                 RI        PI       Comments

[Series 1: us mfm ob follow-up · 0.28mm/px · 12 of 33 slices shown]
[im 2/33]
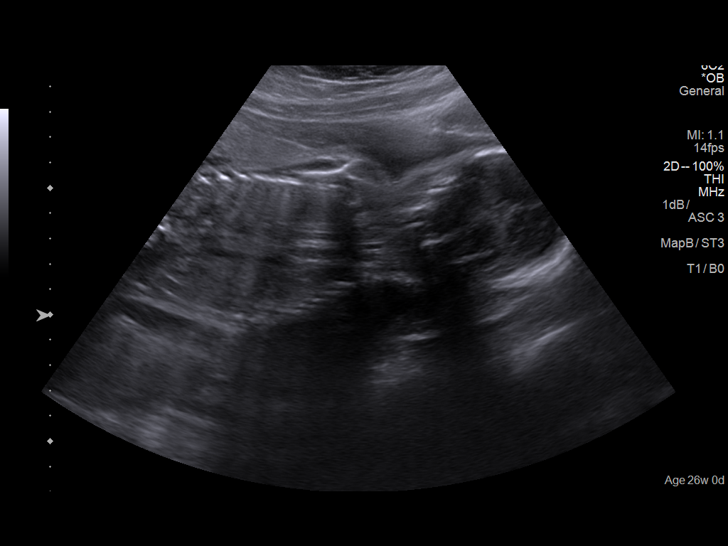
[im 4/33]
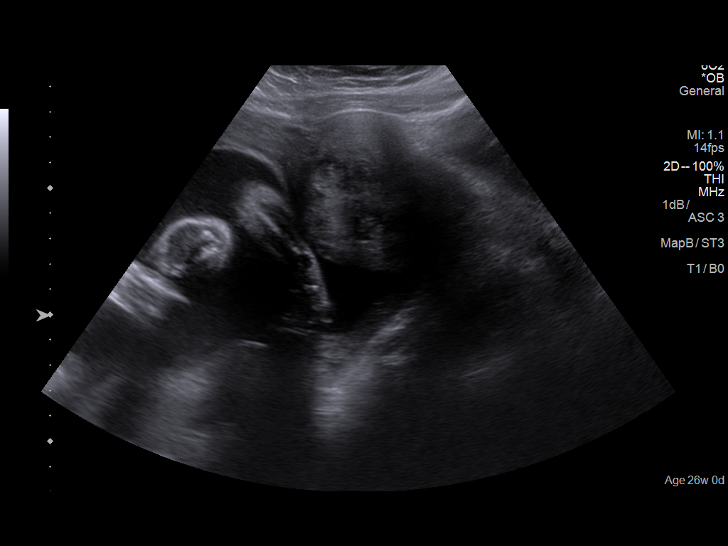
[im 6/33]
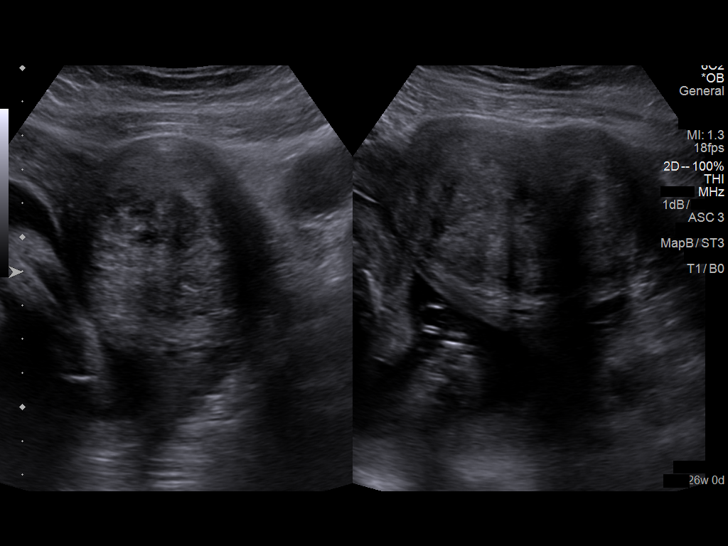
[im 10/33]
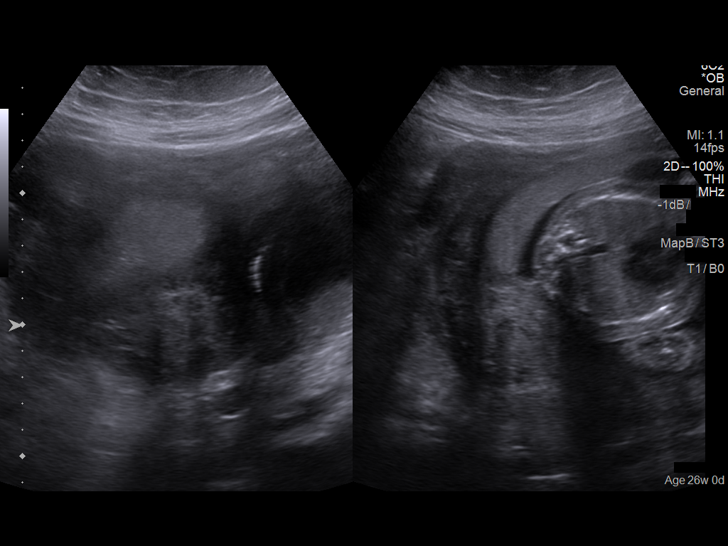
[im 12/33]
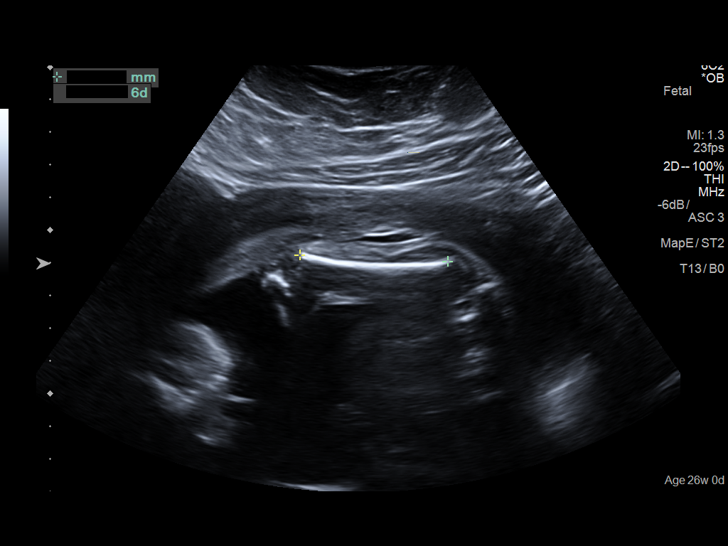
[im 15/33]
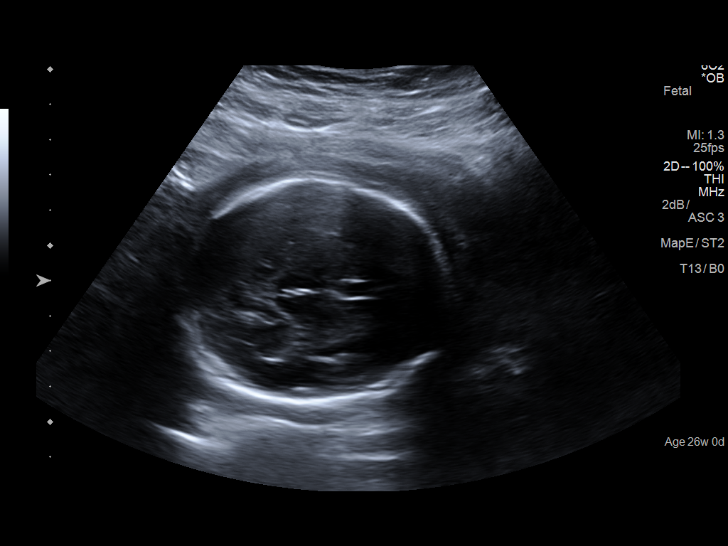
[im 18/33]
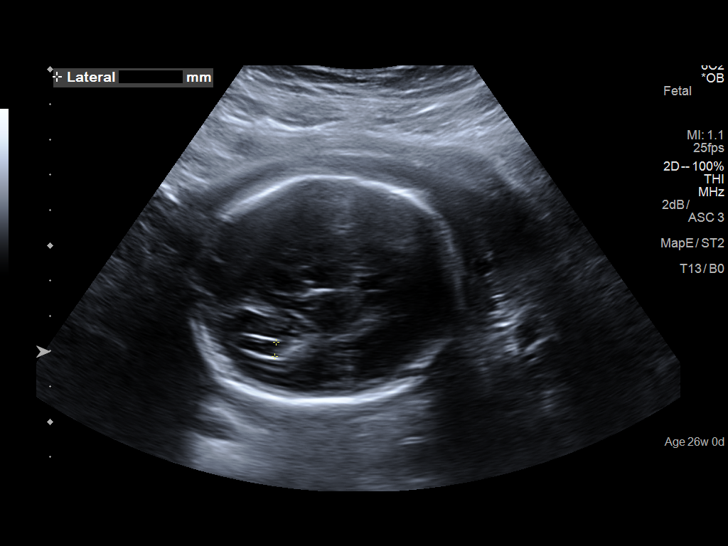
[im 21/33]
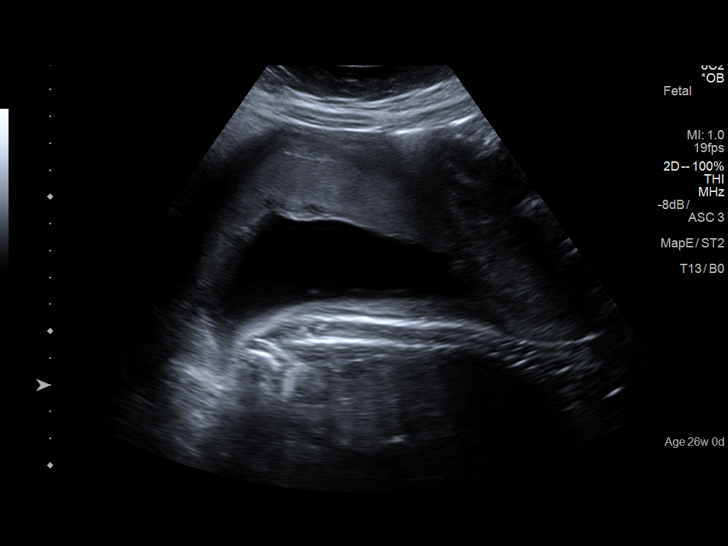
[im 23/33]
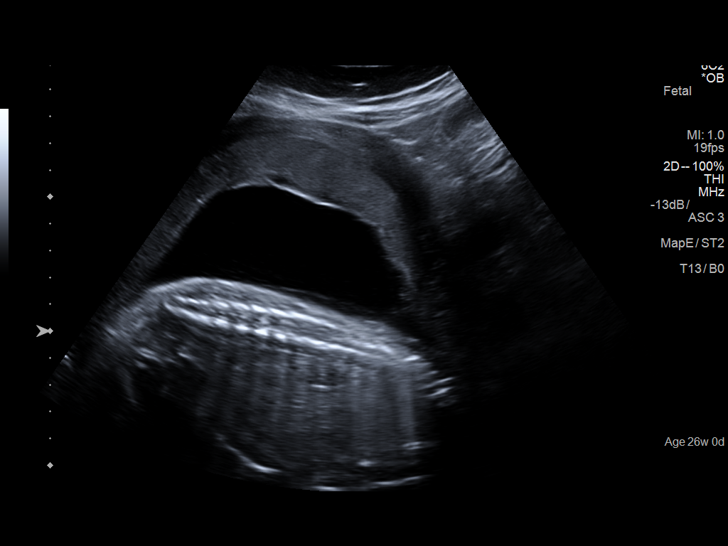
[im 27/33]
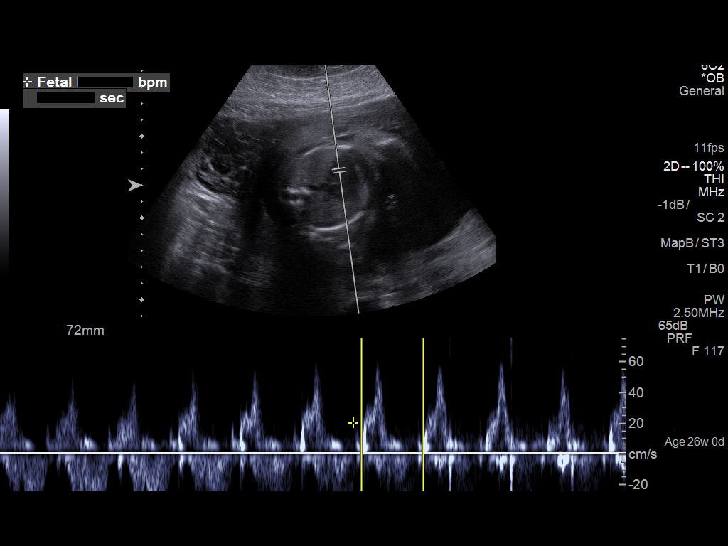
[im 29/33]
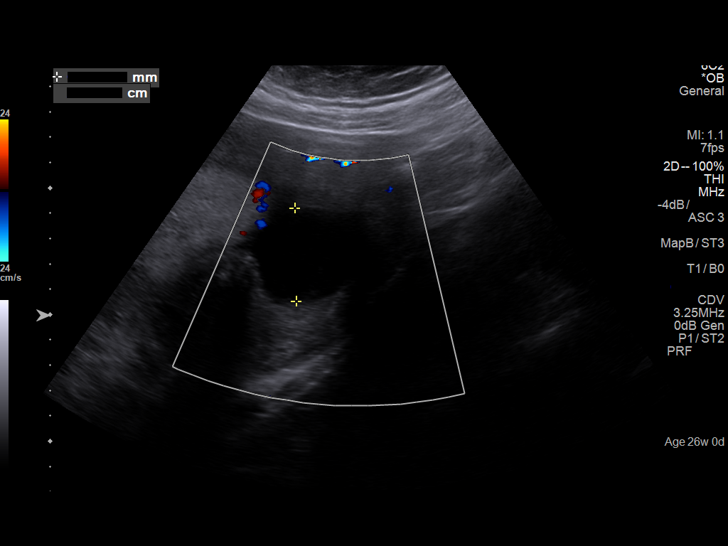
[im 31/33]
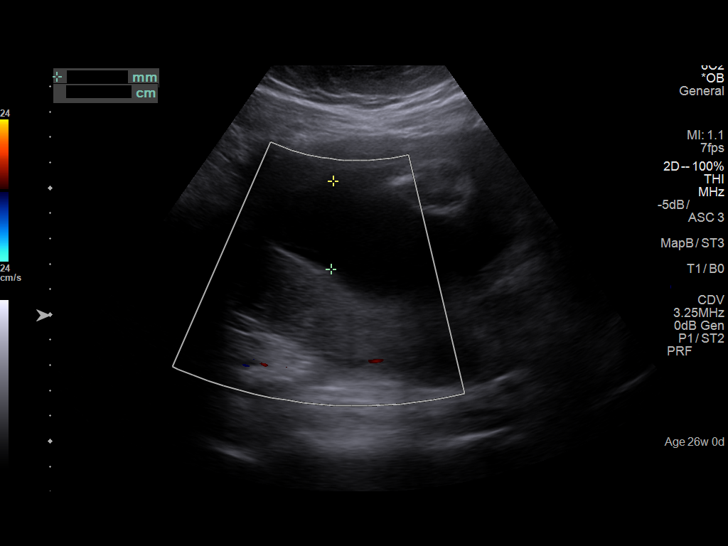

[12 of 28 positions shown; findings below may reference images not displayed]

IMPRESSION: Dear Dr.   MEKONNEN,

 Thank you for referring your patient for a follow up growth and
 fetal anatomical survey.  Ultrasound demonstrates a single,
 live pregnancy at 26 weeks gestation.  Dating is by LMP
 consistent with 12 week 4 day ultraound performed at
 [facility] on 03/05/18.

 The fetal biometry correlates with established dating.

 Images of the fetal heart and profile were obtained and
 appear normal.  The previously imaged uterine myomas were
 again seen (as noted above) and appear similar in size.

 Recommend follow up fetal growht in 4 weeks. Please
 schedule where convenient.

 Thank you for allowing us to participate in your patient's care.

 assistance.
                  Ludmila, [employer]

## 2020-07-23 DIAGNOSIS — Z03818 Encounter for observation for suspected exposure to other biological agents ruled out: Secondary | ICD-10-CM | POA: Diagnosis not present

## 2020-07-23 DIAGNOSIS — Z20822 Contact with and (suspected) exposure to covid-19: Secondary | ICD-10-CM | POA: Diagnosis not present

## 2020-08-06 DIAGNOSIS — Z20822 Contact with and (suspected) exposure to covid-19: Secondary | ICD-10-CM | POA: Diagnosis not present

## 2020-08-06 DIAGNOSIS — Z03818 Encounter for observation for suspected exposure to other biological agents ruled out: Secondary | ICD-10-CM | POA: Diagnosis not present

## 2020-11-04 NOTE — Progress Notes (Signed)
   Patient ID: Alexis Cordova, female   DOB: 1981/05/27, 40 y.o.   MRN: [id number]  Reason for Consult: Follow-up (Poss miscarriage )   Referred by No ref. provider found  Subjective:     HPI:  Alexis Cordova is a 40 y.o. female. She reports spotting and possivle miscarriage concerns  Gynecological History  Patient's last menstrual period was 12/07/2017.   History of fibroids, polyps, or ovarian cysts? : no  History of PCOS? no Hstory of Endometriosis? no History of abnormal pap smears? no Have you had any sexually transmitted infections in the past? no    Past Medical History:  Diagnosis Date  . Anxiety   . Dysmenorrhea   . Fibroid   . Pap smear abnormality of cervix with ASCUS favoring benign    No family history on file. Past Surgical History:  Procedure Laterality Date  . CESAREAN SECTION N/A 07/18/2018   Procedure: CESAREAN SECTION;  Surgeon: Gae Dry, MD;  Location: [facility] ORS;  Service: Obstetrics;  Laterality: N/A;  . DILATION AND CURETTAGE OF UTERUS    . HERNIA REPAIR     Umbilical hernia  . KNEE ARTHROSCOPY W/ MENISCAL REPAIR Left     Short Social History:  Social History   Tobacco Use  . Smoking status: Former Research scientist (life sciences)  . Smokeless tobacco: Never Used  Substance Use Topics  . Alcohol use: No    Allergies  Allergen Reactions  . Shellfish Allergy     Just sick     Current Outpatient Medications  Medication Sig Dispense Refill  . Drospirenone (SLYND) 4 MG TABS Take 1 tablet by mouth daily. (Patient not taking: Reported on 06/06/2019) 30 tablet 12  . EPINEPHrine 0.3 mg/0.3 mL IJ SOAJ injection Inject 0.3 mLs (0.3 mg total) into the muscle as needed for anaphylaxis. 1 each 1  . Prenatal Vit-Fe Fumarate-FA (PRENATAL MULTIVITAMIN) TABS tablet Take 1 tablet by mouth daily at 12 noon.     No current facility-administered medications for this visit.    REVIEW OF SYSTEMS      Objective:  Objective   Vitals:   02/26/18 1018  BP:  130/80  Weight: 181 lb (82.1 kg)   Body mass index is 34.2 kg/m.  Physical Exam  Assessment/Plan:     40 yo with Threatened miscarriage Bedside US reassuring, continue care with close follow up planned.    Adrian Prows MD [facility] 11/04/2020 12:17 PM

## 2021-01-09 ENCOUNTER — Encounter: Payer: Self-pay | Admitting: Adult Health

## 2021-01-09 ENCOUNTER — Ambulatory Visit: Payer: Self-pay | Admitting: Adult Health

## 2021-01-09 ENCOUNTER — Other Ambulatory Visit: Payer: Self-pay

## 2021-01-09 VITALS — BP 164/98 | HR 96 | Temp 98.2°F | Resp 14 | Ht 61.0 in | Wt 207.0 lb

## 2021-01-09 DIAGNOSIS — J3489 Other specified disorders of nose and nasal sinuses: Secondary | ICD-10-CM

## 2021-01-09 NOTE — Progress Notes (Signed)
[facility] [place], [place] [address]   Office Visit Note  Patient Name: Alexis Cordova  [id number]  [id number]  Date of Service: 01/09/2021  Chief Complaint  Patient presents with   Ear Pain     HPI Pt is here for a sick visit.  She reports For the last few days she has noticed some ear fullness, and pressure.  She feels like they are clogged.  Her hearing has decreased.  She had ear pain for one day, but that resolved.     Current Medication:  Outpatient Encounter Medications as of 01/09/2021  Medication Sig   EPINEPHrine 0.3 mg/0.3 mL IJ SOAJ injection Inject 0.3 mLs (0.3 mg total) into the muscle as needed for anaphylaxis.   [DISCONTINUED] Drospirenone (SLYND) 4 MG TABS Take 1 tablet by mouth daily. (Patient not taking: Reported on 06/06/2019)   [DISCONTINUED] Prenatal Vit-Fe Fumarate-FA (PRENATAL MULTIVITAMIN) TABS tablet Take 1 tablet by mouth daily at 12 noon.   No facility-administered encounter medications on file as of 01/09/2021.      Medical History: Past Medical History:  Diagnosis Date   Anxiety    Dysmenorrhea    Fibroid    Pap smear abnormality of cervix with ASCUS favoring benign      Vital Signs: BP (!) 164/98   Pulse 96   Temp 98.2 F (36.8 C)   Resp 14   Ht 5\' 1"  (1.549 m)   Wt 207 lb (93.9 kg)   SpO2 97%   BMI 39.11 kg/m    Review of Systems  Constitutional:  Negative for activity change and appetite change.  HENT:  Positive for ear pain, postnasal drip and sinus pressure.    Physical Exam Constitutional:      Appearance: Normal appearance.  HENT:     Right Ear: Tympanic membrane and ear canal normal. Decreased hearing noted. No drainage or swelling.     Left Ear: Tympanic membrane and ear canal normal. Decreased hearing noted. No drainage or swelling.     Nose:     Comments: Clear drainage, pink nares. Neurological:     Mental Status: She is alert.    Assessment/Plan: 1. Sinus  pressure Encouraged patient to try Flonase, 2 sprays, each  nostril twice daily for one week.  Also encouraged her to use a decongestant medication.  IF symptoms persist, follow up in clinic.      General Counseling: Jakiera verbalizes understanding of the findings of todays visit and agrees with plan of treatment. I have discussed any further diagnostic evaluation that may be needed or ordered today. We also reviewed her medications today. she has been encouraged to call the office with any questions or concerns that should arise related to todays visit.   No orders of the defined types were placed in this encounter.   No orders of the defined types were placed in this encounter.   Time spent:20 Minutes    Kendell Bane AGNP-C Nurse Practitioner

## 2021-01-21 ENCOUNTER — Telehealth: Payer: Self-pay

## 2021-01-21 ENCOUNTER — Other Ambulatory Visit: Payer: Self-pay | Admitting: Physician Assistant

## 2021-01-21 DIAGNOSIS — J3489 Other specified disorders of nose and nasal sinuses: Secondary | ICD-10-CM

## 2021-01-21 MED ORDER — FEXOFENADINE-PSEUDOEPHED ER 60-120 MG PO TB12: 1.0000 | ORAL_TABLET | Freq: Two times a day (BID) | ORAL | 0 refills | Status: DC

## 2021-01-21 MED ORDER — AMOXICILLIN 875 MG PO TABS: 875.0000 mg | ORAL_TABLET | Freq: Two times a day (BID) | ORAL | 0 refills | Status: AC

## 2021-01-21 NOTE — Telephone Encounter (Signed)
Discussed with patient rationale for starting 10-day course of antibiotics along with a decongestant/antihistamine.

## 2021-01-21 NOTE — Telephone Encounter (Signed)
Alexis Cordova) saw Orson Gear, NP-C on 01/09/21 for sinus pressure & ear pain.  On Friday (01/18/21) she contacted the clinic & said it's not gotten any better & requested an ENT referral.   AMD

## 2021-02-12 ENCOUNTER — Other Ambulatory Visit: Payer: Self-pay

## 2021-02-12 ENCOUNTER — Ambulatory Visit (INDEPENDENT_AMBULATORY_CARE_PROVIDER_SITE_OTHER): Payer: 59 | Admitting: Obstetrics & Gynecology

## 2021-02-12 ENCOUNTER — Encounter: Payer: Self-pay | Admitting: Obstetrics & Gynecology

## 2021-02-12 VITALS — BP 152/96 | HR 108 | Ht 61.0 in | Wt 207.0 lb

## 2021-02-12 DIAGNOSIS — I158 Other secondary hypertension: Secondary | ICD-10-CM

## 2021-02-12 DIAGNOSIS — Z1321 Encounter for screening for nutritional disorder: Secondary | ICD-10-CM

## 2021-02-12 DIAGNOSIS — Z01419 Encounter for gynecological examination (general) (routine) without abnormal findings: Secondary | ICD-10-CM | POA: Diagnosis not present

## 2021-02-12 DIAGNOSIS — Z131 Encounter for screening for diabetes mellitus: Secondary | ICD-10-CM | POA: Diagnosis not present

## 2021-02-12 DIAGNOSIS — Z13220 Encounter for screening for lipoid disorders: Secondary | ICD-10-CM

## 2021-02-12 DIAGNOSIS — Z1329 Encounter for screening for other suspected endocrine disorder: Secondary | ICD-10-CM

## 2021-02-12 NOTE — Patient Instructions (Addendum)
PAP every three years (due 2023) Labs every two years (due soon)  Thank you for choosing [facility]. As part of our ongoing efforts to improve patient experience, we would appreciate your feedback. Please fill out the short survey that you will receive by mail or MyChart. Your opinion is important to [place]! - Dr. Kenton Kingfisher  Recommendations to boost your immunity to prevent illness such as viral flu and colds, including covid19, are as follows:       - - -  Vitamin K2 and Vitamin D3  - - - Take Vitamin K2 at 200-300 mcg daily (usually 2-3 pills daily of the over the counter formulation). Take Vitamin D3 at 3000-4000 U daily (usually 3-4 pills daily of the over the counter formulation). Studies show that these two at high normal levels in your system are very effective in keeping your immunity so strong and protective that you will be unlikely to contract viral illness such as those listed above.  Dr Kenton Kingfisher  Primary Care in the area  This is not meant to be comprehensive list, but a resource for some providers that you can call for counseling or medication needs. If you have a recommendation, please let us know.   [facility]:  [phone]               Dr Miguel Aschoff               Dr Juanetta Beets               Dr Lavon Paganini  [facility]:  [phone]  Dr Drue Stager [facility] [place]:   Coleman, MD  Waunita Schooner, MD  Ria Bush, MD  Jinny Sanders, MD  [facility], [place]: [phone]  Orland Mustard, MD

## 2021-02-12 NOTE — Progress Notes (Signed)
HPI:      [name] is a 40 y.o. [phone] who LMP was Patient's last menstrual period was 01/28/2021., she presents today for her annual examination. The patient has no complaints today; she has problems with weight gain; periods are regular.  Pt has h/o fibroids, but no sx's at this time. The patient is sexually active. Her last pap: approximate date 2020 and was normal. The patient does perform self breast exams.  There is notable family history of breast or ovarian cancer in her family.  The patient has regular exercise: yes.  The patient denies current symptoms of depression.    GYN History: Contraception: none  PMHx: Past Medical History:  Diagnosis Date   Anxiety    Dysmenorrhea    Fibroid    Pap smear abnormality of cervix with ASCUS favoring benign    Past Surgical History:  Procedure Laterality Date   CESAREAN SECTION N/A 07/18/2018   Procedure: CESAREAN SECTION;  Surgeon: Gae Dry, MD;  Location: [facility] ORS;  Service: Obstetrics;  Laterality: N/A;   DILATION AND CURETTAGE OF UTERUS     HERNIA REPAIR     Umbilical hernia   KNEE ARTHROSCOPY W/ MENISCAL REPAIR Left    History reviewed. No pertinent family history. Social History   Tobacco Use   Smoking status: Former   Smokeless tobacco: Never  Scientific laboratory technician Use: Never used  Substance Use Topics   Alcohol use: Yes    Comment: 4   Drug use: No    Current Outpatient Medications:    EPINEPHrine 0.3 mg/0.3 mL IJ SOAJ injection, Inject 0.3 mLs (0.3 mg total) into the muscle as needed for anaphylaxis., Disp: 1 each, Rfl: 1   fexofenadine-pseudoephedrine (ALLEGRA-D) 60-120 MG 12 hr tablet, Take 1 tablet by mouth 2 (two) times daily., Disp: 20 tablet, Rfl: 0 Allergies: Shellfish allergy  Review of Systems  Constitutional:  Negative for chills, fever and malaise/fatigue.  HENT:  Negative for congestion, sinus pain and sore throat.   Eyes:  Negative for blurred vision and pain.  Respiratory:   Negative for cough and wheezing.   Cardiovascular:  Negative for chest pain and leg swelling.  Gastrointestinal:  Negative for abdominal pain, constipation, diarrhea, heartburn, nausea and vomiting.  Genitourinary:  Negative for dysuria, frequency, hematuria and urgency.  Musculoskeletal:  Negative for back pain, joint pain, myalgias and neck pain.  Skin:  Negative for itching and rash.  Neurological:  Positive for tingling. Negative for dizziness, tremors and weakness.  Endo/Heme/Allergies:  Does not bruise/bleed easily.  Psychiatric/Behavioral:  Negative for depression. The patient is not nervous/anxious and does not have insomnia.    Objective: BP (!) 152/96   Pulse (!) 108   Ht '5\' 1"'$  (1.549 m)   Wt 207 lb (93.9 kg)   LMP 01/28/2021   BMI 39.11 kg/m   Filed Weights   02/12/21 0808  Weight: 207 lb (93.9 kg)   Body mass index is 39.11 kg/m. Physical Exam Constitutional:      General: She is not in acute distress.    Appearance: She is well-developed.  Genitourinary:     Bladder, rectum and urethral meatus normal.     No lesions in the vagina.     Right Labia: No rash, tenderness or lesions.    Left Labia: No tenderness, lesions or rash.    No vaginal bleeding.      Right Adnexa: not tender and no mass present.    Left  Adnexa: not tender and no mass present.    No cervical motion tenderness, friability, lesion or polyp.     Uterus is not enlarged.     No uterine mass detected.    Pelvic exam was performed with patient in the lithotomy position.  Breasts:    Right: No mass, skin change or tenderness.     Left: No mass, skin change or tenderness.  HENT:     Head: Normocephalic and atraumatic. No laceration.     Right Ear: Hearing normal.     Left Ear: Hearing normal.     Mouth/Throat:     Pharynx: Uvula midline.  Eyes:     Pupils: Pupils are equal, round, and reactive to light.  Neck:     Thyroid: No thyromegaly.  Cardiovascular:     Rate and Rhythm: Normal rate  and regular rhythm.     Heart sounds: No murmur heard.   No friction rub. No gallop.  Pulmonary:     Effort: Pulmonary effort is normal. No respiratory distress.     Breath sounds: Normal breath sounds. No wheezing.  Abdominal:     General: Bowel sounds are normal. There is no distension.     Palpations: Abdomen is soft.     Tenderness: no abdominal tenderness There is no rebound.  Musculoskeletal:        General: Normal range of motion.     Cervical back: Normal range of motion and neck supple.  Neurological:     Mental Status: She is alert and oriented to person, place, and time.     Cranial Nerves: No cranial nerve deficit.  Skin:    General: Skin is warm and dry.  Psychiatric:        Judgment: Judgment normal.  Vitals reviewed.    Assessment:  ANNUAL EXAM 1. Women's annual routine gynecological examination   2. Screening for thyroid disorder   3. Encounter for vitamin deficiency screening   4. Screening for cholesterol level   5. Screening for diabetes mellitus   6. Other secondary hypertension      Screening Plan:            1.  Cervical Screening-  Pap smear schedule reviewed with patient, Pap smear to be scheduled  2. Breast screening- Exam annually and mammogram>40 planned   3. BP up today; referral to internal medicine  4. Labs Ordered today  5. Counseling for contraception: no method  [facility] - 02/12/21 0808       Pregnancy Intention Screening   Does the patient want to become pregnant in the next year? No    Does the patient's partner want to become pregnant in the next year? Yes    Would the patient like to discuss contraceptive options today? No      Contraception Wrap Up   Current Method Abstinence    End Method No Contraception Precautions    Contraception Counseling Provided No            The pregnancy intention screening data noted above was reviewed. Potential methods of contraception were discussed. The patient elected to proceed with  No Method - No Contraceptive Precautions.   Considering covid vaccine Declines flu vaccine     F/U  Return in about 1 year (around 02/12/2022) for Annual.  Barnett Applebaum, MD, [facility], [facility] 02/12/2021  8:45 AM

## 2021-02-13 LAB — LIPID PANEL
Chol/HDL Ratio: 4.7 ratio — ABNORMAL HIGH (ref 0.0–4.4)
Cholesterol, Total: 205 mg/dL — ABNORMAL HIGH (ref 100–199)
HDL: 44 mg/dL (ref >39)
LDL Chol Calc (NIH): 138 mg/dL — ABNORMAL HIGH (ref 0–99)
Triglycerides: 128 mg/dL (ref 0–149)
VLDL Cholesterol Cal: 23 mg/dL (ref 5–40)

## 2021-02-13 LAB — TSH: TSH: 1.48 u[IU]/mL (ref 0.450–4.500)

## 2021-02-13 LAB — GLUCOSE, FASTING: Glucose, Plasma: 98 mg/dL (ref 65–99)

## 2021-02-13 LAB — VITAMIN D 25 HYDROXY (VIT D DEFICIENCY, FRACTURES): Vit D, 25-Hydroxy: 15.4 ng/mL — ABNORMAL LOW (ref 30.0–100.0)

## 2021-02-21 DIAGNOSIS — J301 Allergic rhinitis due to pollen: Secondary | ICD-10-CM | POA: Diagnosis not present

## 2021-02-21 DIAGNOSIS — H6123 Impacted cerumen, bilateral: Secondary | ICD-10-CM | POA: Diagnosis not present

## 2021-02-21 DIAGNOSIS — H6063 Unspecified chronic otitis externa, bilateral: Secondary | ICD-10-CM | POA: Diagnosis not present

## 2021-02-26 ENCOUNTER — Ambulatory Visit: Payer: Self-pay

## 2021-02-26 ENCOUNTER — Other Ambulatory Visit: Payer: Self-pay

## 2021-02-26 VITALS — BP 152/86 | Resp 14

## 2021-02-26 DIAGNOSIS — Z0130 Encounter for examination of blood pressure without abnormal findings: Secondary | ICD-10-CM

## 2021-02-26 NOTE — Progress Notes (Signed)
Discussed stress management techniques such as breathing exercises and taking breaks. Avoid salty foods, request restaurants to hold spices / seasonings, avoid fried or fatty foods. Alexis Cordova did not wish to speak with provider today about BP management but did agree to come back and get BP checked again. She will make an appointment next week.

## 2021-03-24 ENCOUNTER — Encounter: Payer: Self-pay | Admitting: Nurse Practitioner

## 2021-03-24 DIAGNOSIS — I10 Essential (primary) hypertension: Secondary | ICD-10-CM | POA: Insufficient documentation

## 2021-03-24 DIAGNOSIS — E7800 Pure hypercholesterolemia, unspecified: Secondary | ICD-10-CM | POA: Insufficient documentation

## 2021-03-24 DIAGNOSIS — R030 Elevated blood-pressure reading, without diagnosis of hypertension: Secondary | ICD-10-CM | POA: Insufficient documentation

## 2021-03-24 DIAGNOSIS — E559 Vitamin D deficiency, unspecified: Secondary | ICD-10-CM | POA: Insufficient documentation

## 2021-03-24 DIAGNOSIS — D219 Benign neoplasm of connective and other soft tissue, unspecified: Secondary | ICD-10-CM | POA: Insufficient documentation

## 2021-03-29 ENCOUNTER — Other Ambulatory Visit: Payer: Self-pay

## 2021-03-29 ENCOUNTER — Ambulatory Visit (INDEPENDENT_AMBULATORY_CARE_PROVIDER_SITE_OTHER): Payer: 59 | Admitting: Nurse Practitioner

## 2021-03-29 ENCOUNTER — Encounter: Payer: Self-pay | Admitting: Nurse Practitioner

## 2021-03-29 VITALS — BP 138/90 | HR 97 | Temp 99.0°F | Ht 62.5 in | Wt 208.8 lb

## 2021-03-29 DIAGNOSIS — R030 Elevated blood-pressure reading, without diagnosis of hypertension: Secondary | ICD-10-CM

## 2021-03-29 DIAGNOSIS — Z7689 Persons encountering health services in other specified circumstances: Secondary | ICD-10-CM | POA: Diagnosis not present

## 2021-03-29 DIAGNOSIS — E7800 Pure hypercholesterolemia, unspecified: Secondary | ICD-10-CM | POA: Diagnosis not present

## 2021-03-29 DIAGNOSIS — E559 Vitamin D deficiency, unspecified: Secondary | ICD-10-CM | POA: Diagnosis not present

## 2021-03-29 DIAGNOSIS — Z1159 Encounter for screening for other viral diseases: Secondary | ICD-10-CM

## 2021-03-29 LAB — MICROALBUMIN, URINE WAIVED
Creatinine, Urine Waived: 50 mg/dL (ref 10–300)
Microalb, Ur Waived: 10 mg/L (ref 0–19)
Microalb/Creat Ratio: <30 mg/g (ref <30)

## 2021-03-29 NOTE — Patient Instructions (Signed)

## 2021-03-29 NOTE — Assessment & Plan Note (Signed)
Noted on recent labs -- continue supplement and will recheck future visit.

## 2021-03-29 NOTE — Assessment & Plan Note (Signed)
Noted on past labs, recommend heavy focus on diet and regular exercise.

## 2021-03-29 NOTE — Progress Notes (Signed)
New Patient Office Visit  Subjective:  Patient ID: Alexis Cordova, female    DOB: Mar 18, 1981  Age: 40 y.o. MRN: [id number]  CC:  Chief Complaint  Patient presents with   Hypertension    Patient states she has noticed over the last month or 2 she has noticed her blood pressure has been elevated. Patient states she does not medication and she does not want to start any medications. Patient states she typically check her blood pressure in the mornings when she was checking it.     HPI Alexis Cordova presents for new patient visit to establish care.  Introduced to Designer, jewellery role and practice setting.  All questions answered.  Discussed provider/patient relationship and expectations.  Has not had PCP in some time -- often saw occupational health at work -- [place], [employer].  Followed by Dr. Kenton Kingfisher for GYN -- fibroids and dysmenorrhea.  Has had some ongoing nerve issues to bilateral hands since pregnancy 2 years ago, noticed most with holding items and writing.  Numbness, no significant pain with this.    HYPERTENSION Has had elevations the last 2 months -- she does have increased stressors and feels weight may be involved.  Has been cutting salt out for a long time, but does eat out and occasionally drinks alcohol.    Recent labs with Dr. Kenton Kingfisher -- Vitamin D was low, she is taking supplement. Hypertension status: uncontrolled  Duration of hypertension:  2 months ago BP monitoring frequency:  rarely BP range: 152/86 at office 02/26/21 Previous BP meds: none Aspirin: no Recurrent headaches: no Visual changes: no Palpitations: no Dyspnea: no Chest pain: no Lower extremity edema: no Dizzy/lightheaded: no   Past Medical History:  Diagnosis Date   Anxiety    Dysmenorrhea    Fibroid    Pap smear abnormality of cervix with ASCUS favoring benign     Past Surgical History:  Procedure Laterality Date   CESAREAN SECTION N/A 07/18/2018   Procedure:  CESAREAN SECTION;  Surgeon: Gae Dry, MD;  Location: [facility] ORS;  Service: Obstetrics;  Laterality: N/A;   DILATION AND CURETTAGE OF UTERUS     HERNIA REPAIR     Umbilical hernia   KNEE ARTHROSCOPY W/ MENISCAL REPAIR Left     Family History  Problem Relation Age of Onset   Hypertension Mother     Social History   Socioeconomic History   Marital status: Married    Spouse name: Freida Busman   Number of children: Not on file   Years of education: Not on file   Highest education level: Not on file  Occupational History   Not on file  Tobacco Use   Smoking status: Former   Smokeless tobacco: Never  Vaping Use   Vaping Use: Never used  Substance and Sexual Activity   Alcohol use: Yes    Comment: 4   Drug use: No   Sexual activity: Yes    Birth control/protection: None  Other Topics Concern   Not on file  Social History Narrative   Not on file   Social Determinants of Health   Financial Resource Strain: Low Risk    Difficulty of Paying Living Expenses: Not hard at all  Food Insecurity: No Food Insecurity   Worried About Charity fundraiser in the Last Year: Never true   [place] in the Last Year: Never true  Transportation Needs: No Transportation Needs   Lack of Transportation (Medical): No  Lack of Transportation (Non-Medical): No  Physical Activity: Inactive   Days of Exercise per Week: 0 days   Minutes of Exercise per Session: 0 min  Stress: No Stress Concern Present   Feeling of Stress : Only a little  Social Connections: Moderately Isolated   Frequency of Communication with Friends and Family: Twice a week   Frequency of Social Gatherings with Friends and Family: Twice a week   Attends Religious Services: Never   Marine scientist or Organizations: No   Attends Music therapist: Never   Marital Status: Married  Human resources officer Violence: Not At Risk   Fear of Current or Ex-Partner: No   Emotionally Abused: No   Physically Abused:  No   Sexually Abused: No    ROS Review of Systems  Constitutional:  Negative for activity change, appetite change, diaphoresis, fatigue and fever.  Respiratory:  Negative for cough, chest tightness and shortness of breath.   Cardiovascular:  Negative for chest pain, palpitations and leg swelling.  Gastrointestinal: Negative.   Neurological: Negative.   Psychiatric/Behavioral: Negative.     Objective:   Today's Vitals: BP 138/90 (BP Location: Left Arm, Patient Position: Sitting)   Pulse 97   Temp 99 F (37.2 C)   Ht 5' 2.5" (1.588 m)   Wt 208 lb 12.8 oz (94.7 kg)   SpO2 99%   BMI 37.58 kg/m   Physical Exam Vitals and nursing note reviewed.  Constitutional:      General: She is awake. She is not in acute distress.    Appearance: She is well-developed and well-groomed. She is obese. She is not ill-appearing or toxic-appearing.  HENT:     Head: Normocephalic.  Eyes:     General:        Right eye: No discharge.        Left eye: No discharge.     Conjunctiva/sclera: Conjunctivae normal.     Pupils: Pupils are equal, round, and reactive to light.  Neck:     Thyroid: No thyromegaly.     Vascular: No carotid bruit.  Cardiovascular:     Rate and Rhythm: Normal rate and regular rhythm.     Heart sounds: Normal heart sounds.  Pulmonary:     Effort: Pulmonary effort is normal. No accessory muscle usage or respiratory distress.     Breath sounds: Normal breath sounds.  Abdominal:     General: Bowel sounds are normal.     Palpations: Abdomen is soft.  Musculoskeletal:     Cervical back: Normal range of motion and neck supple.  Lymphadenopathy:     Cervical: No cervical adenopathy.  Skin:    General: Skin is warm and dry.  Neurological:     Mental Status: She is alert and oriented to person, place, and time.  Psychiatric:        Attention and Perception: Attention normal.        Mood and Affect: Mood normal.        Speech: Speech normal.        Behavior: Behavior normal.  Behavior is cooperative.        Thought Content: Thought content normal.    Assessment & Plan:   Problem List Items Addressed This Visit       Other   Vitamin D deficiency    Noted on recent labs -- continue supplement and will recheck future visit.      Elevated low density lipoprotein (LDL) cholesterol level    Noted  on past labs, recommend heavy focus on diet and regular exercise.      Elevated BP without diagnosis of hypertension - Primary    Noted on recent checks.  She would prefer not to take medication.  At this time will trial focus on diet and exercise as BP on recheck did come down.  Recommend she monitor BP at least a few mornings a week at home and document.  DASH diet at home.  Labs today: CBC, CMP, urine ALB.  Return in 3 months.       Relevant Orders   Microalbumin, Urine Waived   Comprehensive metabolic panel   CBC with Differential/Platelet   Other Visit Diagnoses     Need for hepatitis C screening test       Hep C screening on labs today.   Relevant Orders   Hepatitis C Antibody   Encounter to establish care           Outpatient Encounter Medications as of 03/29/2021  Medication Sig   EPINEPHrine 0.3 mg/0.3 mL IJ SOAJ injection Inject 0.3 mLs (0.3 mg total) into the muscle as needed for anaphylaxis.   [DISCONTINUED] fexofenadine-pseudoephedrine (ALLEGRA-D) 60-120 MG 12 hr tablet Take 1 tablet by mouth 2 (two) times daily. (Patient not taking: Reported on 03/29/2021)   No facility-administered encounter medications on file as of 03/29/2021.    Follow-up: Return in about 3 months (around 06/28/2021) for BP CHECK.   Venita Lick, NP

## 2021-03-29 NOTE — Assessment & Plan Note (Signed)
Noted on recent checks.  She would prefer not to take medication.  At this time will trial focus on diet and exercise as BP on recheck did come down.  Recommend she monitor BP at least a few mornings a week at home and document.  DASH diet at home.  Labs today: CBC, CMP, urine ALB.  Return in 3 months.

## 2021-03-30 LAB — COMPREHENSIVE METABOLIC PANEL
ALT: 25 IU/L (ref 0–32)
AST: 21 IU/L (ref 0–40)
Albumin/Globulin Ratio: 1.6 (ref 1.2–2.2)
Albumin: 4.5 g/dL (ref 3.8–4.8)
Alkaline Phosphatase: 65 IU/L (ref 44–121)
BUN/Creatinine Ratio: 10 (ref 9–23)
BUN: 7 mg/dL (ref 6–20)
Bilirubin Total: 0.2 mg/dL (ref 0.0–1.2)
CO2: 24 mmol/L (ref 20–29)
Calcium: 9.8 mg/dL (ref 8.7–10.2)
Chloride: 98 mmol/L (ref 96–106)
Creatinine, Ser: 0.67 mg/dL (ref 0.57–1.00)
Globulin, Total: 2.9 g/dL (ref 1.5–4.5)
Glucose: 83 mg/dL (ref 65–99)
Potassium: 4.3 mmol/L (ref 3.5–5.2)
Sodium: 139 mmol/L (ref 134–144)
Total Protein: 7.4 g/dL (ref 6.0–8.5)
eGFR: 115 mL/min/{1.73_m2} (ref >59)

## 2021-03-30 LAB — CBC WITH DIFFERENTIAL/PLATELET
Basophils Absolute: 0 10*3/uL (ref 0.0–0.2)
Basos: 0 %
EOS (ABSOLUTE): 0.3 10*3/uL (ref 0.0–0.4)
Eos: 3 %
Hematocrit: 37.4 % (ref 34.0–46.6)
Hemoglobin: 12 g/dL (ref 11.1–15.9)
Immature Grans (Abs): 0 10*3/uL (ref 0.0–0.1)
Immature Granulocytes: 0 %
Lymphocytes Absolute: 3 10*3/uL (ref 0.7–3.1)
Lymphs: 39 %
MCH: 24 pg — ABNORMAL LOW (ref 26.6–33.0)
MCHC: 32.1 g/dL (ref 31.5–35.7)
MCV: 75 fL — ABNORMAL LOW (ref 79–97)
Monocytes Absolute: 0.6 10*3/uL (ref 0.1–0.9)
Monocytes: 8 %
Neutrophils Absolute: 3.9 10*3/uL (ref 1.4–7.0)
Neutrophils: 50 %
Platelets: 389 10*3/uL (ref 150–450)
RBC: 5 x10E6/uL (ref 3.77–5.28)
RDW: 16.3 % — ABNORMAL HIGH (ref 11.7–15.4)
WBC: 7.8 10*3/uL (ref 3.4–10.8)

## 2021-03-30 LAB — HEPATITIS C ANTIBODY: Hep C Virus Ab: 0.1 s/co ratio (ref 0.0–0.9)

## 2021-03-30 NOTE — Progress Notes (Signed)
Contacted via MyChart   Good morning Alexis Cordova, your labs have returned: - CMP shows normal kidney function, creatinine and eGFR, and normal liver function, AST and ALT.   - Urine shows minimal protein in urine, meaning kidneys are stable with elevation in blood pressure recently. - CBC shows no anemia.   - Hep C is negative.  Any questions? Keep being amazing!!  Thank you for allowing me to participate in your care.  I appreciate you. Kindest regards, Palmina Clodfelter

## 2021-06-28 ENCOUNTER — Ambulatory Visit: Payer: 59 | Admitting: Nurse Practitioner

## 2021-08-02 ENCOUNTER — Encounter: Payer: Self-pay | Admitting: Nurse Practitioner

## 2021-08-02 ENCOUNTER — Other Ambulatory Visit: Payer: Self-pay

## 2021-08-02 ENCOUNTER — Ambulatory Visit (INDEPENDENT_AMBULATORY_CARE_PROVIDER_SITE_OTHER): Payer: 59 | Admitting: Nurse Practitioner

## 2021-08-02 VITALS — BP 136/86 | HR 92 | Temp 98.5°F | Wt 209.2 lb

## 2021-08-02 DIAGNOSIS — Z6837 Body mass index (BMI) 37.0-37.9, adult: Secondary | ICD-10-CM

## 2021-08-02 DIAGNOSIS — E6609 Other obesity due to excess calories: Secondary | ICD-10-CM

## 2021-08-02 DIAGNOSIS — E559 Vitamin D deficiency, unspecified: Secondary | ICD-10-CM

## 2021-08-02 DIAGNOSIS — R030 Elevated blood-pressure reading, without diagnosis of hypertension: Secondary | ICD-10-CM | POA: Diagnosis not present

## 2021-08-02 NOTE — Assessment & Plan Note (Signed)
Ongoing, however reduces to normal levels with rechecks -- ultimate goal is <130/80 which discussed with patient.  She would prefer not to take medication, which is appropriate at this time.  Will continue focus on diet and exercise as BP on recheck did come down.  Recommend she monitor BP at least a few mornings a week at home and document.  DASH diet at home + work on meditation techniques and trial OTC supplements for stress.  Labs recently reassuring.  Return in 6 months.

## 2021-08-02 NOTE — Assessment & Plan Note (Signed)
Ongoing.  Noted on recent labs -- continue supplement and will recheck future visit. 

## 2021-08-02 NOTE — Progress Notes (Signed)
BP 136/86 (BP Location: Left Arm, Patient Position: Sitting, Cuff Size: Large)    Pulse 92    Temp 98.5 F (36.9 C) (Oral)    Wt 209 lb 3.2 oz (94.9 kg)    SpO2 98%    BMI 37.65 kg/m    Subjective:    Patient ID: Alexis Cordova, female    DOB: 1981/06/02, 41 y.o.   MRN: [id number]  HPI: [name] is a 41 y.o. female  Chief Complaint  Patient presents with   Blood Pressure Check     Patient states she is here for a  blood pressure check. Patient states she has really been stressed at her job recently.    HYPERTENSION Has not been checking BP at home as recommended last visit.  Has been working on reducing salt in diet + has cut back on alcohol use (was only social drinker). Recently has had work stressors and home life stressors, which she feels is not helping blood pressure.  Works for [employer] in [place] and has been there for 4 years with lots of turnover present in setting.  Vitamin D level was low on recent labs at 15.4 and she is taking supplement daily. Hypertension status: uncontrolled  BP range: not checking at home Previous BP meds: Aspirin: no Recurrent headaches: no Visual changes: no Palpitations: no Dyspnea: no Chest pain: no Lower extremity edema: no Dizzy/lightheaded: no   Relevant past medical, surgical, family and social history reviewed and updated as indicated. Interim medical history since our last visit reviewed. Allergies and medications reviewed and updated.  Review of Systems  Constitutional:  Negative for activity change, appetite change, diaphoresis, fatigue and fever.  Respiratory:  Negative for cough, chest tightness and shortness of breath.   Cardiovascular:  Negative for chest pain, palpitations and leg swelling.  Gastrointestinal: Negative.   Neurological: Negative.   Psychiatric/Behavioral: Negative.     Per HPI unless specifically indicated above     Objective:    BP 136/86 (BP Location: Left Arm, Patient  Position: Sitting, Cuff Size: Large)    Pulse 92    Temp 98.5 F (36.9 C) (Oral)    Wt 209 lb 3.2 oz (94.9 kg)    SpO2 98%    BMI 37.65 kg/m   Wt Readings from Last 3 Encounters:  08/02/21 209 lb 3.2 oz (94.9 kg)  03/29/21 208 lb 12.8 oz (94.7 kg)  02/12/21 207 lb (93.9 kg)    Physical Exam Vitals and nursing note reviewed.  Constitutional:      General: She is awake. She is not in acute distress.    Appearance: She is well-developed and well-groomed. She is obese. She is not ill-appearing or toxic-appearing.  HENT:     Head: Normocephalic.  Eyes:     General:        Right eye: No discharge.        Left eye: No discharge.     Conjunctiva/sclera: Conjunctivae normal.     Pupils: Pupils are equal, round, and reactive to light.  Neck:     Thyroid: No thyromegaly.     Vascular: No carotid bruit.  Cardiovascular:     Rate and Rhythm: Normal rate and regular rhythm.     Heart sounds: Normal heart sounds.  Pulmonary:     Effort: Pulmonary effort is normal. No accessory muscle usage or respiratory distress.     Breath sounds: Normal breath sounds.  Abdominal:     General: Bowel sounds  are normal.     Palpations: Abdomen is soft.  Musculoskeletal:     Cervical back: Normal range of motion and neck supple.  Lymphadenopathy:     Cervical: No cervical adenopathy.  Skin:    General: Skin is warm and dry.  Neurological:     Mental Status: She is alert and oriented to person, place, and time.  Psychiatric:        Attention and Perception: Attention normal.        Mood and Affect: Mood normal.        Speech: Speech normal.        Behavior: Behavior normal. Behavior is cooperative.        Thought Content: Thought content normal.   Results for orders placed or performed in visit on 03/29/21  Microalbumin, Urine Waived  Result Value Ref Range   Microalb, Ur Waived 10 0 - 19 mg/L   Creatinine, Urine Waived 50 10 - 300 mg/dL   Microalb/Creat Ratio <30 <30 mg/g  Comprehensive  metabolic panel  Result Value Ref Range   Glucose 83 65 - 99 mg/dL   BUN 7 6 - 20 mg/dL   Creatinine, Ser 0.67 0.57 - 1.00 mg/dL   eGFR 115 >59 mL/min/1.73   BUN/Creatinine Ratio 10 9 - 23   Sodium 139 134 - 144 mmol/L   Potassium 4.3 3.5 - 5.2 mmol/L   Chloride 98 96 - 106 mmol/L   CO2 24 20 - 29 mmol/L   Calcium 9.8 8.7 - 10.2 mg/dL   Total Protein 7.4 6.0 - 8.5 g/dL   Albumin 4.5 3.8 - 4.8 g/dL   Globulin, Total 2.9 1.5 - 4.5 g/dL   Albumin/Globulin Ratio 1.6 1.2 - 2.2   Bilirubin Total 0.2 0.0 - 1.2 mg/dL   Alkaline Phosphatase 65 44 - 121 IU/L   AST 21 0 - 40 IU/L   ALT 25 0 - 32 IU/L  CBC with Differential/Platelet  Result Value Ref Range   WBC 7.8 3.4 - 10.8 x10E3/uL   RBC 5.00 3.77 - 5.28 x10E6/uL   Hemoglobin 12.0 11.1 - 15.9 g/dL   Hematocrit 37.4 34.0 - 46.6 %   MCV 75 (L) 79 - 97 fL   MCH 24.0 (L) 26.6 - 33.0 pg   MCHC 32.1 31.5 - 35.7 g/dL   RDW 16.3 (H) 11.7 - 15.4 %   Platelets 389 150 - 450 x10E3/uL   Neutrophils 50 Not Estab. %   Lymphs 39 Not Estab. %   Monocytes 8 Not Estab. %   Eos 3 Not Estab. %   Basos 0 Not Estab. %   Neutrophils Absolute 3.9 1.4 - 7.0 x10E3/uL   Lymphocytes Absolute 3.0 0.7 - 3.1 x10E3/uL   Monocytes Absolute 0.6 0.1 - 0.9 x10E3/uL   EOS (ABSOLUTE) 0.3 0.0 - 0.4 x10E3/uL   Basophils Absolute 0.0 0.0 - 0.2 x10E3/uL   Immature Granulocytes 0 Not Estab. %   Immature Grans (Abs) 0.0 0.0 - 0.1 x10E3/uL  Hepatitis C Antibody  Result Value Ref Range   Hep C Virus Ab 0.1 0.0 - 0.9 s/co ratio      Assessment & Plan:   Problem List Items Addressed This Visit       Other   Elevated BP without diagnosis of hypertension - Primary    Ongoing, however reduces to normal levels with rechecks -- ultimate goal is <130/80 which discussed with patient.  She would prefer not to take medication, which is appropriate at this time.  Will continue focus on diet and exercise as BP on recheck did come down.  Recommend she monitor BP at least a few  mornings a week at home and document.  DASH diet at home + work on meditation techniques and trial OTC supplements for stress.  Labs recently reassuring.  Return in 6 months.       Obesity    BMI 37.65.  Recommended eating smaller high protein, low fat meals more frequently and exercising 30 mins a day 5 times a week with a goal of 10-15lb weight loss in the next 3 months. Patient voiced their understanding and motivation to adhere to these recommendations.       Vitamin D deficiency    Ongoing.  Noted on recent labs -- continue supplement and will recheck future visit.        Follow up plan: Return in about 6 months (around 01/30/2022) for BP CHECK and VIT D.

## 2021-08-02 NOTE — Assessment & Plan Note (Signed)
BMI 37.65.  Recommended eating smaller high protein, low fat meals more frequently and exercising 30 mins a day 5 times a week with a goal of 10-15lb weight loss in the next 3 months. Patient voiced their understanding and motivation to adhere to these recommendations.

## 2021-08-02 NOTE — Patient Instructions (Signed)

## 2022-01-25 NOTE — Patient Instructions (Signed)

## 2022-01-30 ENCOUNTER — Ambulatory Visit (INDEPENDENT_AMBULATORY_CARE_PROVIDER_SITE_OTHER): Payer: 59 | Admitting: Nurse Practitioner

## 2022-01-30 ENCOUNTER — Encounter: Payer: Self-pay | Admitting: Nurse Practitioner

## 2022-01-30 VITALS — BP 152/94 | HR 116 | Temp 98.3°F | Ht 62.5 in | Wt 213.6 lb

## 2022-01-30 DIAGNOSIS — R200 Anesthesia of skin: Secondary | ICD-10-CM | POA: Diagnosis not present

## 2022-01-30 DIAGNOSIS — E559 Vitamin D deficiency, unspecified: Secondary | ICD-10-CM

## 2022-01-30 DIAGNOSIS — I10 Essential (primary) hypertension: Secondary | ICD-10-CM

## 2022-01-30 MED ORDER — OLMESARTAN MEDOXOMIL 5 MG PO TABS: 5.0000 mg | ORAL_TABLET | Freq: Every day | ORAL | 4 refills | Status: DC

## 2022-01-30 NOTE — Progress Notes (Signed)
BP (!) 152/94 (BP Location: Left Arm, Patient Position: Sitting, Cuff Size: Large)   Pulse (!) 116   Temp 98.3 F (36.8 C) (Oral)   Ht 5' 2.5" (1.588 m)   Wt 213 lb 9.6 oz (96.9 kg)   SpO2 98%   Breastfeeding No   BMI 38.45 kg/m    Subjective:    Patient ID: Alexis Cordova, female    DOB: 12-22-1980, 41 y.o.   MRN: [id number]  HPI: Alexis Cordova is a 41 y.o. female  Chief Complaint  Patient presents with   Hypertension    Patient is here for six month follow up on Blood Pressure check. Patient says her blood pressure is not going well due to her current job being passed the stressed level. Patient says she can now tell when her BP is elevated. Patient says she just knows and she says it may just be because she is in a frantic state of mind.    Vitamin D   Numbness    Patient says she first noticed it with her pregnancy three years ago. Patient says if she holds something for long periods of time, she will notice her hands become tingling. Patient say she can be driving and she feels the tingling sensation.    HYPERTENSION Continues to have elevation in blood pressure due to current work stressors.  [name] in [employer], just came off of biggest time of year and had to take on other duties.  Working on getting into therapy and finding new position.    Was noted to have low Vitamin D recent labs and taking supplement at home. Hypertension status: uncontrolled  Satisfied with current treatment? yes Duration of hypertension: chronic BP monitoring frequency:  a few times a week BP range: elevated at home BP medication side effects:  no Medication compliance: good compliance Aspirin: no Recurrent headaches: no Visual changes: no Palpitations: no Dyspnea: no Chest pain: no Lower extremity edema: no Dizzy/lightheaded: no   NUMBNESS Tingling feeling started when she was pregnant, when holding something for too long feels it in hands L>R.  They  thought she had carpal tunnel when pregnant, wore brace.  Started noticing this more over past 2 weeks. Duration: chronic with worsening lately Onset: gradual Location: both hands (fingers) Bilateral: yes Symmetric: yes Decreased sensation: no  Weakness: no Pain: no Trauma: no Recent illness: no Diabetes: no Thyroid disease: no  HIV: no  Alcoholism: no  Spinal cord injury: no Alleviating factors: stretching out Aggravating factors: unknown Status: worse Treatments attempted:  braces  Relevant past medical, surgical, family and social history reviewed and updated as indicated. Interim medical history since our last visit reviewed. Allergies and medications reviewed and updated.  Review of Systems  Constitutional:  Negative for activity change, appetite change, diaphoresis, fatigue and fever.  Respiratory:  Negative for cough, chest tightness and shortness of breath.   Cardiovascular:  Negative for chest pain, palpitations and leg swelling.  Gastrointestinal: Negative.   Neurological:  Positive for numbness. Negative for dizziness, tremors, facial asymmetry, speech difficulty, weakness and headaches.  Psychiatric/Behavioral: Negative.      Per HPI unless specifically indicated above     Objective:    BP (!) 152/94 (BP Location: Left Arm, Patient Position: Sitting, Cuff Size: Large)   Pulse (!) 116   Temp 98.3 F (36.8 C) (Oral)   Ht 5' 2.5" (1.588 m)   Wt 213 lb 9.6 oz (96.9 kg)   SpO2 98%  Breastfeeding No   BMI 38.45 kg/m   Wt Readings from Last 3 Encounters:  01/30/22 213 lb 9.6 oz (96.9 kg)  08/02/21 209 lb 3.2 oz (94.9 kg)  03/29/21 208 lb 12.8 oz (94.7 kg)    Physical Exam Vitals and nursing note reviewed.  Constitutional:      General: She is awake. She is not in acute distress.    Appearance: She is well-developed and well-groomed. She is obese. She is not ill-appearing or toxic-appearing.  HENT:     Head: Normocephalic.  Eyes:     General:         Right eye: No discharge.        Left eye: No discharge.     Conjunctiva/sclera: Conjunctivae normal.     Pupils: Pupils are equal, round, and reactive to light.  Neck:     Thyroid: No thyromegaly.     Vascular: No carotid bruit.  Cardiovascular:     Rate and Rhythm: Normal rate and regular rhythm.     Heart sounds: Normal heart sounds.  Pulmonary:     Effort: Pulmonary effort is normal. No accessory muscle usage or respiratory distress.     Breath sounds: Normal breath sounds.  Abdominal:     General: Bowel sounds are normal.     Palpations: Abdomen is soft.  Musculoskeletal:     Right hand: Normal.     Left hand: Normal.     Cervical back: Normal range of motion and neck supple.  Lymphadenopathy:     Cervical: No cervical adenopathy.  Skin:    General: Skin is warm and dry.  Neurological:     Mental Status: She is alert and oriented to person, place, and time.     Sensory: Sensation is intact.     Comments: Negative Phalen and Tinel.  Psychiatric:        Attention and Perception: Attention normal.        Mood and Affect: Mood normal.        Speech: Speech normal.        Behavior: Behavior normal. Behavior is cooperative.        Thought Content: Thought content normal.     Results for orders placed or performed in visit on 03/29/21  Microalbumin, Urine Waived  Result Value Ref Range   Microalb, Ur Waived 10 0 - 19 mg/L   Creatinine, Urine Waived 50 10 - 300 mg/dL   Microalb/Creat Ratio <30 <30 mg/g  Comprehensive metabolic panel  Result Value Ref Range   Glucose 83 65 - 99 mg/dL   BUN 7 6 - 20 mg/dL   Creatinine, Ser 4.14 0.57 - 1.00 mg/dL   eGFR 206 >58 ZL/YJD/3.65   BUN/Creatinine Ratio 10 9 - 23   Sodium 139 134 - 144 mmol/L   Potassium 4.3 3.5 - 5.2 mmol/L   Chloride 98 96 - 106 mmol/L   CO2 24 20 - 29 mmol/L   Calcium 9.8 8.7 - 10.2 mg/dL   Total Protein 7.4 6.0 - 8.5 g/dL   Albumin 4.5 3.8 - 4.8 g/dL   Globulin, Total 2.9 1.5 - 4.5 g/dL    Albumin/Globulin Ratio 1.6 1.2 - 2.2   Bilirubin Total 0.2 0.0 - 1.2 mg/dL   Alkaline Phosphatase 65 44 - 121 IU/L   AST 21 0 - 40 IU/L   ALT 25 0 - 32 IU/L  CBC with Differential/Platelet  Result Value Ref Range   WBC 7.8 3.4 - 10.8 x10E3/uL  RBC 5.00 3.77 - 5.28 x10E6/uL   Hemoglobin 12.0 11.1 - 15.9 g/dL   Hematocrit 37.4 34.0 - 46.6 %   MCV 75 (L) 79 - 97 fL   MCH 24.0 (L) 26.6 - 33.0 pg   MCHC 32.1 31.5 - 35.7 g/dL   RDW 16.3 (H) 11.7 - 15.4 %   Platelets 389 150 - 450 x10E3/uL   Neutrophils 50 Not Estab. %   Lymphs 39 Not Estab. %   Monocytes 8 Not Estab. %   Eos 3 Not Estab. %   Basos 0 Not Estab. %   Neutrophils Absolute 3.9 1.4 - 7.0 x10E3/uL   Lymphocytes Absolute 3.0 0.7 - 3.1 x10E3/uL   Monocytes Absolute 0.6 0.1 - 0.9 x10E3/uL   EOS (ABSOLUTE) 0.3 0.0 - 0.4 x10E3/uL   Basophils Absolute 0.0 0.0 - 0.2 x10E3/uL   Immature Granulocytes 0 Not Estab. %   Immature Grans (Abs) 0.0 0.0 - 0.1 x10E3/uL  Hepatitis C Antibody  Result Value Ref Range   Hep C Virus Ab 0.1 0.0 - 0.9 s/co ratio      Assessment & Plan:   Problem List Items Addressed This Visit       Cardiovascular and Mediastinum   Essential hypertension - Primary    Ongoing with elevations in office today and at home.  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  At this time discussed at length with her concerns with elevations -- will start Olmesartan 5 MG daily and increase to 10 MG daily in one week if BP at home remains >130/80.  Educated her on this medication + side effects.  Recommend she monitor BP at least a few mornings a week at home and document.  DASH diet at home. Labs next visit.  Return in 4 weeks.      Relevant Medications   olmesartan (BENICAR) 5 MG tablet     Other   Bilateral hand numbness    Suspect some carpal tunnel present, mild, may be exacerbated by BP elevations -- refer to HTN plan of care.  Start wearing braces at  home.  If worsening will get into neurology or ortho.      Vitamin D deficiency    Ongoing.  Noted on recent labs -- continue supplement and will recheck future visit.        Follow up plan: Return in about 4 weeks (around 02/27/2022) for HTN -- started Olmesartan.

## 2022-01-30 NOTE — Assessment & Plan Note (Signed)
Suspect some carpal tunnel present, mild, may be exacerbated by BP elevations -- refer to HTN plan of care.  Start wearing braces at home.  If worsening will get into neurology or ortho.

## 2022-01-30 NOTE — Assessment & Plan Note (Signed)
Ongoing with elevations in office today and at home.  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  At this time discussed at length with her concerns with elevations -- will start Olmesartan 5 MG daily and increase to 10 MG daily in one week if BP at home remains >130/80.  Educated her on this medication + side effects.  Recommend she monitor BP at least a few mornings a week at home and document.  DASH diet at home. Labs next visit.  Return in 4 weeks.

## 2022-01-30 NOTE — Assessment & Plan Note (Signed)
Ongoing.  Noted on recent labs -- continue supplement and will recheck future visit.

## 2022-02-28 ENCOUNTER — Ambulatory Visit: Payer: 59 | Admitting: Nurse Practitioner

## 2022-02-28 DIAGNOSIS — I10 Essential (primary) hypertension: Secondary | ICD-10-CM

## 2022-02-28 DIAGNOSIS — E7800 Pure hypercholesterolemia, unspecified: Secondary | ICD-10-CM

## 2022-02-28 DIAGNOSIS — E6609 Other obesity due to excess calories: Secondary | ICD-10-CM

## 2022-02-28 DIAGNOSIS — E559 Vitamin D deficiency, unspecified: Secondary | ICD-10-CM

## 2022-03-02 NOTE — Patient Instructions (Signed)

## 2022-03-07 ENCOUNTER — Ambulatory Visit (INDEPENDENT_AMBULATORY_CARE_PROVIDER_SITE_OTHER): Payer: 59 | Admitting: Nurse Practitioner

## 2022-03-07 ENCOUNTER — Encounter: Payer: Self-pay | Admitting: Nurse Practitioner

## 2022-03-07 VITALS — BP 128/82 | HR 96 | Temp 98.1°F | Ht 62.5 in | Wt 208.8 lb

## 2022-03-07 DIAGNOSIS — E7800 Pure hypercholesterolemia, unspecified: Secondary | ICD-10-CM | POA: Diagnosis not present

## 2022-03-07 DIAGNOSIS — E6609 Other obesity due to excess calories: Secondary | ICD-10-CM | POA: Diagnosis not present

## 2022-03-07 DIAGNOSIS — I10 Essential (primary) hypertension: Secondary | ICD-10-CM

## 2022-03-07 DIAGNOSIS — E559 Vitamin D deficiency, unspecified: Secondary | ICD-10-CM

## 2022-03-07 DIAGNOSIS — Z6837 Body mass index (BMI) 37.0-37.9, adult: Secondary | ICD-10-CM

## 2022-03-07 MED ORDER — OLMESARTAN MEDOXOMIL 5 MG PO TABS: 5.0000 mg | ORAL_TABLET | Freq: Every day | ORAL | 4 refills | Status: DC

## 2022-03-07 NOTE — Assessment & Plan Note (Signed)
BMI 37.58 -- working on weight loss and has lost 5 pounds.  Recommended eating smaller high protein, low fat meals more frequently and exercising 30 mins a day 5 times a week with a goal of 10-15lb weight loss in the next 3 months. Patient voiced their understanding and motivation to adhere to these recommendations.

## 2022-03-07 NOTE — Assessment & Plan Note (Signed)
Noted on past labs, recommend heavy focus on diet and regular exercise.  Check Lipid panel today.

## 2022-03-07 NOTE — Assessment & Plan Note (Signed)
Ongoing.  Noted on recent labs -- continue supplement and will recheck today.

## 2022-03-07 NOTE — Assessment & Plan Note (Signed)
Chronic, stable with initiation of Olmesartan.  Tolerating this well without ADR.  Continue current medication regimen and adjust as needed.  Recommend she monitor BP at least a few mornings a week at home and document.  DASH diet at home.  Continue current medication regimen and adjust as needed.  Labs today: CBC, CMP, TSH, Lipid.  Return in 6 months.

## 2022-03-07 NOTE — Progress Notes (Signed)
BP 128/82 (BP Location: Left Arm, Patient Position: Sitting, Cuff Size: Large)   Pulse 96   Temp 98.1 F (36.7 C) (Oral)   Ht 5' 2.5" (1.588 m)   Wt 208 lb 12.8 oz (94.7 kg)   SpO2 99%   BMI 37.58 kg/m    Subjective:    Patient ID: Alexis Cordova, female    DOB: 06/28/1981, 41 y.o.   MRN: [id number]  HPI: Alexis Cordova is a 41 y.o. female  Chief Complaint  Patient presents with   Hypertension    Patient is here to follow up on Hypertension with the start of medication. Patient says she seems to be doing OK. Patient denies having any concerns at today's visit.    HYPERTENSION Presents today for follow-up, started Olmesartan on 01/30/22.  She has been working on weight loss, has lost 5 pounds at this time with intermittent fasting.  Works in stressful environment.  History of low Vitamin D Hypertension status: stable  Satisfied with current treatment? yes Duration of hypertension: chronic BP monitoring frequency:  a few times a week BP range: 147/90 last check at home, but has higher stress job BP medication side effects:  no Medication compliance: good compliance Previous BP meds: Olmesartan Aspirin: no Recurrent headaches: no Visual changes: no Palpitations: no Dyspnea: no Chest pain: no Lower extremity edema: no Dizzy/lightheaded: no   Relevant past medical, surgical, family and social history reviewed and updated as indicated. Interim medical history since our last visit reviewed. Allergies and medications reviewed and updated.  Review of Systems  Constitutional:  Negative for activity change, appetite change, diaphoresis, fatigue and fever.  Respiratory:  Negative for cough, chest tightness and shortness of breath.   Cardiovascular:  Negative for chest pain, palpitations and leg swelling.  Gastrointestinal: Negative.   Neurological: Negative.   Psychiatric/Behavioral: Negative.      Per HPI unless specifically indicated above     Objective:     BP 128/82 (BP Location: Left Arm, Patient Position: Sitting, Cuff Size: Large)   Pulse 96   Temp 98.1 F (36.7 C) (Oral)   Ht 5' 2.5" (1.588 m)   Wt 208 lb 12.8 oz (94.7 kg)   SpO2 99%   BMI 37.58 kg/m   Wt Readings from Last 3 Encounters:  03/07/22 208 lb 12.8 oz (94.7 kg)  01/30/22 213 lb 9.6 oz (96.9 kg)  08/02/21 209 lb 3.2 oz (94.9 kg)    Physical Exam Vitals and nursing note reviewed.  Constitutional:      General: She is awake. She is not in acute distress.    Appearance: She is well-developed and well-groomed. She is obese. She is not ill-appearing or toxic-appearing.  HENT:     Head: Normocephalic.  Eyes:     General:        Right eye: No discharge.        Left eye: No discharge.     Conjunctiva/sclera: Conjunctivae normal.     Pupils: Pupils are equal, round, and reactive to light.  Neck:     Thyroid: No thyromegaly.     Vascular: No carotid bruit.  Cardiovascular:     Rate and Rhythm: Normal rate and regular rhythm.     Heart sounds: Normal heart sounds.  Pulmonary:     Effort: Pulmonary effort is normal. No accessory muscle usage or respiratory distress.     Breath sounds: Normal breath sounds.  Abdominal:     General: Bowel sounds are normal.  Palpations: Abdomen is soft.  Musculoskeletal:     Right hand: Normal.     Left hand: Normal.     Cervical back: Normal range of motion and neck supple.  Lymphadenopathy:     Cervical: No cervical adenopathy.  Skin:    General: Skin is warm and dry.  Neurological:     Mental Status: She is alert and oriented to person, place, and time.     Sensory: Sensation is intact.  Psychiatric:        Attention and Perception: Attention normal.        Mood and Affect: Mood normal.        Speech: Speech normal.        Behavior: Behavior normal. Behavior is cooperative.        Thought Content: Thought content normal.    Results for orders placed or performed in visit on 03/29/21  Microalbumin, Urine Waived  Result  Value Ref Range   Microalb, Ur Waived 10 0 - 19 mg/L   Creatinine, Urine Waived 50 10 - 300 mg/dL   Microalb/Creat Ratio <30 <30 mg/g  Comprehensive metabolic panel  Result Value Ref Range   Glucose 83 65 - 99 mg/dL   BUN 7 6 - 20 mg/dL   Creatinine, Ser 0.67 0.57 - 1.00 mg/dL   eGFR 115 >59 mL/min/1.73   BUN/Creatinine Ratio 10 9 - 23   Sodium 139 134 - 144 mmol/L   Potassium 4.3 3.5 - 5.2 mmol/L   Chloride 98 96 - 106 mmol/L   CO2 24 20 - 29 mmol/L   Calcium 9.8 8.7 - 10.2 mg/dL   Total Protein 7.4 6.0 - 8.5 g/dL   Albumin 4.5 3.8 - 4.8 g/dL   Globulin, Total 2.9 1.5 - 4.5 g/dL   Albumin/Globulin Ratio 1.6 1.2 - 2.2   Bilirubin Total 0.2 0.0 - 1.2 mg/dL   Alkaline Phosphatase 65 44 - 121 IU/L   AST 21 0 - 40 IU/L   ALT 25 0 - 32 IU/L  CBC with Differential/Platelet  Result Value Ref Range   WBC 7.8 3.4 - 10.8 x10E3/uL   RBC 5.00 3.77 - 5.28 x10E6/uL   Hemoglobin 12.0 11.1 - 15.9 g/dL   Hematocrit 37.4 34.0 - 46.6 %   MCV 75 (L) 79 - 97 fL   MCH 24.0 (L) 26.6 - 33.0 pg   MCHC 32.1 31.5 - 35.7 g/dL   RDW 16.3 (H) 11.7 - 15.4 %   Platelets 389 150 - 450 x10E3/uL   Neutrophils 50 Not Estab. %   Lymphs 39 Not Estab. %   Monocytes 8 Not Estab. %   Eos 3 Not Estab. %   Basos 0 Not Estab. %   Neutrophils Absolute 3.9 1.4 - 7.0 x10E3/uL   Lymphocytes Absolute 3.0 0.7 - 3.1 x10E3/uL   Monocytes Absolute 0.6 0.1 - 0.9 x10E3/uL   EOS (ABSOLUTE) 0.3 0.0 - 0.4 x10E3/uL   Basophils Absolute 0.0 0.0 - 0.2 x10E3/uL   Immature Granulocytes 0 Not Estab. %   Immature Grans (Abs) 0.0 0.0 - 0.1 x10E3/uL  Hepatitis C Antibody  Result Value Ref Range   Hep C Virus Ab 0.1 0.0 - 0.9 s/co ratio      Assessment & Plan:   Problem List Items Addressed This Visit       Cardiovascular and Mediastinum   Essential hypertension - Primary    Chronic, stable with initiation of Olmesartan.  Tolerating this well without ADR.  Continue current medication  regimen and adjust as needed.  Recommend  she monitor BP at least a few mornings a week at home and document.  DASH diet at home.  Continue current medication regimen and adjust as needed.  Labs today: CBC, CMP, TSH, Lipid.  Return in 6 months.      Relevant Medications   olmesartan (BENICAR) 5 MG tablet   Other Relevant Orders   Basic metabolic panel   CBC with Differential/Platelet   TSH     Other   Elevated low density lipoprotein (LDL) cholesterol level    Noted on past labs, recommend heavy focus on diet and regular exercise.  Check Lipid panel today.      Relevant Orders   Basic metabolic panel   Lipid Panel w/o Chol/HDL Ratio   Obesity    BMI 37.58 -- working on weight loss and has lost 5 pounds.  Recommended eating smaller high protein, low fat meals more frequently and exercising 30 mins a day 5 times a week with a goal of 10-15lb weight loss in the next 3 months. Patient voiced their understanding and motivation to adhere to these recommendations.       Vitamin D deficiency    Ongoing.  Noted on recent labs -- continue supplement and will recheck today.      Relevant Orders   VITAMIN D 25 Hydroxy (Vit-D Deficiency, Fractures)     Follow up plan: Return in about 6 months (around 09/07/2022) for HTN/HLD.

## 2022-03-08 ENCOUNTER — Other Ambulatory Visit: Payer: Self-pay | Admitting: Nurse Practitioner

## 2022-03-08 DIAGNOSIS — D500 Iron deficiency anemia secondary to blood loss (chronic): Secondary | ICD-10-CM | POA: Insufficient documentation

## 2022-03-08 DIAGNOSIS — D649 Anemia, unspecified: Secondary | ICD-10-CM

## 2022-03-08 DIAGNOSIS — E7800 Pure hypercholesterolemia, unspecified: Secondary | ICD-10-CM

## 2022-03-08 LAB — LIPID PANEL W/O CHOL/HDL RATIO
Cholesterol, Total: 226 mg/dL — ABNORMAL HIGH (ref 100–199)
HDL: 46 mg/dL (ref >39)
LDL Chol Calc (NIH): 158 mg/dL — ABNORMAL HIGH (ref 0–99)
Triglycerides: 124 mg/dL (ref 0–149)
VLDL Cholesterol Cal: 22 mg/dL (ref 5–40)

## 2022-03-08 LAB — CBC WITH DIFFERENTIAL/PLATELET
Basophils Absolute: 0 10*3/uL (ref 0.0–0.2)
Basos: 0 %
EOS (ABSOLUTE): 0.3 10*3/uL (ref 0.0–0.4)
Eos: 5 %
Hematocrit: 34.2 % (ref 34.0–46.6)
Hemoglobin: 10.3 g/dL — ABNORMAL LOW (ref 11.1–15.9)
Immature Grans (Abs): 0 10*3/uL (ref 0.0–0.1)
Immature Granulocytes: 0 %
Lymphocytes Absolute: 1.9 10*3/uL (ref 0.7–3.1)
Lymphs: 30 %
MCH: 22.3 pg — ABNORMAL LOW (ref 26.6–33.0)
MCHC: 30.1 g/dL — ABNORMAL LOW (ref 31.5–35.7)
MCV: 74 fL — ABNORMAL LOW (ref 79–97)
Monocytes Absolute: 0.5 10*3/uL (ref 0.1–0.9)
Monocytes: 7 %
Neutrophils Absolute: 3.6 10*3/uL (ref 1.4–7.0)
Neutrophils: 58 %
Platelets: 411 10*3/uL (ref 150–450)
RBC: 4.61 x10E6/uL (ref 3.77–5.28)
RDW: 16.5 % — ABNORMAL HIGH (ref 11.7–15.4)
WBC: 6.2 10*3/uL (ref 3.4–10.8)

## 2022-03-08 LAB — VITAMIN D 25 HYDROXY (VIT D DEFICIENCY, FRACTURES): Vit D, 25-Hydroxy: 41.8 ng/mL (ref 30.0–100.0)

## 2022-03-08 LAB — BASIC METABOLIC PANEL
BUN/Creatinine Ratio: 11 (ref 9–23)
BUN: 7 mg/dL (ref 6–20)
CO2: 21 mmol/L (ref 20–29)
Calcium: 9.5 mg/dL (ref 8.7–10.2)
Chloride: 100 mmol/L (ref 96–106)
Creatinine, Ser: 0.66 mg/dL (ref 0.57–1.00)
Glucose: 98 mg/dL (ref 70–99)
Potassium: 4.6 mmol/L (ref 3.5–5.2)
Sodium: 137 mmol/L (ref 134–144)
eGFR: 114 mL/min/{1.73_m2} (ref >59)

## 2022-03-08 LAB — TSH: TSH: 1.63 u[IU]/mL (ref 0.450–4.500)

## 2022-03-08 NOTE — Progress Notes (Signed)
Contacted via [facility]  -- needs lab visit in 2 weeks please (fasting, early morning:))  Good morning Alexis Cordova, your labs have returned: - CBC is showing a little low hemoglobin -- looks like some mild anemia present.  Do you have heavier cycles?  I would like to recheck labs in 2 weeks outpatient only, fasting.  Will check iron level too then to determine if we need to start a little supplement daily.  Ensure to add lots of iron rich foods to diet. - Kidney function, creatinine and eGFR, remains normal. - Vitamin D level normal, continue supplement. - Thyroid level normal. - Your cholesterol is high, but recommendations to make lifestyle changes. Your LDL is above normal. The LDL is the bad cholesterol. Over time and in combination with inflammation and other factors, this contributes to plaque which in turn may lead to stroke and/or heart attack down the road. Sometimes high LDL is primarily genetic, and people might be eating all the right foods but still have high numbers. Other times, there is room for improvement in one's diet and eating healthier can bring this number down and potentially reduce one's risk of heart attack and/or stroke.   To reduce your LDL, Remember - more fruits and vegetables, more fish, and limit red meat and dairy products. More soy, nuts, beans, barley, lentils, oats and plant sterol ester enriched margarine instead of butter. I also encourage eliminating sugar and processed food. Remember, shop on the outside of the grocery store and visit your [employer]. If you would like to talk with me about dietary changes for your cholesterol, please let me know. We should recheck your cholesterol in 12 months.  Any questions? Keep being amazing!!  Thank you for allowing me to participate in your care.  I appreciate you. Kindest regards, Kenyata Guess

## 2022-03-21 ENCOUNTER — Other Ambulatory Visit: Payer: 59

## 2022-03-21 DIAGNOSIS — D649 Anemia, unspecified: Secondary | ICD-10-CM | POA: Diagnosis not present

## 2022-03-21 DIAGNOSIS — E7800 Pure hypercholesterolemia, unspecified: Secondary | ICD-10-CM | POA: Diagnosis not present

## 2022-03-22 ENCOUNTER — Other Ambulatory Visit: Payer: Self-pay | Admitting: Nurse Practitioner

## 2022-03-22 ENCOUNTER — Encounter: Payer: Self-pay | Admitting: Nurse Practitioner

## 2022-03-22 LAB — CBC WITH DIFFERENTIAL/PLATELET
Basophils Absolute: 0 10*3/uL (ref 0.0–0.2)
Basos: 0 %
EOS (ABSOLUTE): 0.2 10*3/uL (ref 0.0–0.4)
Eos: 4 %
Hematocrit: 32 % — ABNORMAL LOW (ref 34.0–46.6)
Hemoglobin: 9.6 g/dL — ABNORMAL LOW (ref 11.1–15.9)
Immature Grans (Abs): 0 10*3/uL (ref 0.0–0.1)
Immature Granulocytes: 0 %
Lymphocytes Absolute: 1.9 10*3/uL (ref 0.7–3.1)
Lymphs: 35 %
MCH: 21.8 pg — ABNORMAL LOW (ref 26.6–33.0)
MCHC: 30 g/dL — ABNORMAL LOW (ref 31.5–35.7)
MCV: 73 fL — ABNORMAL LOW (ref 79–97)
Monocytes Absolute: 0.4 10*3/uL (ref 0.1–0.9)
Monocytes: 7 %
Neutrophils Absolute: 2.9 10*3/uL (ref 1.4–7.0)
Neutrophils: 54 %
Platelets: 393 10*3/uL (ref 150–450)
RBC: 4.41 x10E6/uL (ref 3.77–5.28)
RDW: 16 % — ABNORMAL HIGH (ref 11.7–15.4)
WBC: 5.4 10*3/uL (ref 3.4–10.8)

## 2022-03-22 LAB — LIPID PANEL W/O CHOL/HDL RATIO
Cholesterol, Total: 210 mg/dL — ABNORMAL HIGH (ref 100–199)
HDL: 46 mg/dL (ref >39)
LDL Chol Calc (NIH): 142 mg/dL — ABNORMAL HIGH (ref 0–99)
Triglycerides: 124 mg/dL (ref 0–149)
VLDL Cholesterol Cal: 22 mg/dL (ref 5–40)

## 2022-03-22 LAB — IRON AND TIBC
Iron Saturation: 4 % — CL (ref 15–55)
Iron: 20 ug/dL — ABNORMAL LOW (ref 27–159)
Total Iron Binding Capacity: 481 ug/dL — ABNORMAL HIGH (ref 250–450)
UIBC: 461 ug/dL — ABNORMAL HIGH (ref 131–425)

## 2022-03-22 LAB — VITAMIN B12: Vitamin B-12: 465 pg/mL (ref 232–1245)

## 2022-03-22 LAB — FERRITIN: Ferritin: 7 ng/mL — ABNORMAL LOW (ref 15–150)

## 2022-03-22 MED ORDER — IRON-VITAMIN C 65-125 MG PO TABS: 1.0000 | ORAL_TABLET | Freq: Every day | ORAL | 1 refills | Status: DC

## 2022-03-22 NOTE — Progress Notes (Signed)
Contacted via [facility] -- 6 week follow-up needed with me please:)  Good evening Alexis Cordova, your labs have returned and you are showing some iron deficiency anemia present.  I will send in an iron supplement for you to take daily, please ensure not to miss doses of this.  Also ensure to add more iron rich food to diet.  B12 level at good range and cholesterol levels remain elevated, but no medication needed at this time.  Any questions?  I would like you to return to office in 6 weeks for visit with me and labs.  Will have staff call to schedule this. Keep being amazing!!  Thank you for allowing me to participate in your care.  I appreciate you. Kindest regards, Katilyn Miltenberger

## 2022-05-03 ENCOUNTER — Other Ambulatory Visit: Payer: Self-pay | Admitting: Nurse Practitioner

## 2022-05-04 NOTE — Patient Instructions (Signed)
Iron Deficiency Anemia, Adult  Iron deficiency anemia is when you do not have enough red blood cells or hemoglobin in your blood. This happens because you have too little iron in your body. Hemoglobin carries oxygen to parts of the body. Anemia can cause your body to not get enough oxygen. What are the causes? Not eating enough foods that have iron in them. The body not being able to take in iron well. Blood loss. What increases the risk? Having menstrual periods. Being pregnant. What are the signs or symptoms? Pale skin, lips, and nails. Weakness, dizziness, and getting tired easily. Feeling like you cannot breathe well when moving (shortness of breath). Cold hands and feet. Mild anemia may not cause any symptoms. How is this treated? This condition is treated by finding out why you do not have enough iron and then getting more iron. It may include: Adding foods to your diet that have a lot of iron. Taking iron pills (supplements). If you are pregnant or breastfeeding, you may need to take extra iron. Your diet often does not provide the amount of iron that you need. Getting more vitamin C in your diet. Vitamin C helps your body take in iron. You may need to take iron pills with a glass of orange juice or vitamin C pills. Medicines to make heavy menstrual periods lighter. Surgery or testing procedures to find what is causing the condition. You may need blood tests to see if treatment is working. If the treatment does not seem to be working, you may need more tests. Follow these instructions at home: Medicines Take over-the-counter and prescription medicines only as told by your doctor. This includes iron pills and vitamins. Taking them as told is important because too much iron can be harmful. Take iron pills when your stomach is empty. If you cannot handle this, take them with food. Do not drink milk or take antacids at the same time as your iron pills. Iron pills may turn your poop  (stool)black. If you cannot handle taking iron pills by mouth, ask your doctor about getting iron through: An IV tube. A shot (injection) into a muscle. Eating and drinking Talk with your doctor before changing the foods you eat. Your doctor may tell you to eat foods that have a lot of iron, such as: Liver. Low-fat (lean) beef. Breads and cereals that have iron added to them. Eggs. Dried fruit. Dark green, leafy vegetables. Eat fresh fruits and vegetables that are high in vitamin C. They help your body use iron. Foods with a lot of vitamin C include: Oranges. Peppers. Tomatoes. Mangoes. Managing constipation If you are taking iron pills, they may cause trouble pooping (constipation). To prevent or treat this, you may need to: Drink enough fluid to keep your pee (urine) pale yellow. Take over-the-counter or prescription medicines. Eat foods that are high in fiber. These include beans, whole grains, and fresh fruits and vegetables. Limit foods that are high in fat and sugar. These include fried or sweet foods. General instructions Return to your normal activities when your doctor says that it is safe. Keep all follow-up visits. Contact a doctor if: You feel like you may vomit (nauseous), or you vomit. You feel weak. You get light-headed when getting up from sitting or lying down. You are sweating for no reason. You have trouble pooping. You have worse breathing with physical activity. You have heaviness in your chest. Get help right away if: You faint. If this happens, do not drive yourself   to the hospital. You have a fast heartbeat, or a heartbeat that does not feel regular. Summary Iron deficiency anemia happens when you have too little iron in your body. This condition is treated by finding out why you do not have enough iron in your body and then getting more iron. Take over-the-counter and prescription medicines only as told by your doctor. Eat fresh fruits and vegetables  that are high in vitamin C. Contact a doctor if you have trouble pooping or feel weak. This information is not intended to replace advice given to you by your health care provider. Make sure you discuss any questions you have with your health care provider. Document Revised: 08/08/2021 Document Reviewed: 08/08/2021 Elsevier Patient Education  2023 [employer]  

## 2022-05-05 ENCOUNTER — Encounter: Payer: Self-pay | Admitting: Nurse Practitioner

## 2022-05-05 ENCOUNTER — Ambulatory Visit (INDEPENDENT_AMBULATORY_CARE_PROVIDER_SITE_OTHER): Payer: 59 | Admitting: Nurse Practitioner

## 2022-05-05 VITALS — BP 132/70 | HR 92 | Temp 98.2°F | Ht 62.52 in | Wt 213.0 lb

## 2022-05-05 DIAGNOSIS — I10 Essential (primary) hypertension: Secondary | ICD-10-CM

## 2022-05-05 DIAGNOSIS — D500 Iron deficiency anemia secondary to blood loss (chronic): Secondary | ICD-10-CM

## 2022-05-05 DIAGNOSIS — R69 Illness, unspecified: Secondary | ICD-10-CM | POA: Diagnosis not present

## 2022-05-05 DIAGNOSIS — Z6838 Body mass index (BMI) 38.0-38.9, adult: Secondary | ICD-10-CM

## 2022-05-05 DIAGNOSIS — F418 Other specified anxiety disorders: Secondary | ICD-10-CM | POA: Insufficient documentation

## 2022-05-05 DIAGNOSIS — E6609 Other obesity due to excess calories: Secondary | ICD-10-CM

## 2022-05-05 MED ORDER — EPINEPHRINE 0.3 MG/0.3ML IJ SOAJ: 0.3000 mg | INTRAMUSCULAR | 1 refills | Status: DC | PRN

## 2022-05-05 NOTE — Assessment & Plan Note (Signed)
Noted on recent labs on 03/22/22, suspect from heavier menstrual cycles and history of fibroids.  May need to get her back into GYN if ongoing anemia.  Continue Vitron at this time, is tolerating well and taking daily.  Recheck CBC, iron, ferritin today.  Discussed with her if ongoing lows may get her into hematology.

## 2022-05-05 NOTE — Assessment & Plan Note (Signed)
Due to work life, does not wish to start medication at this time.  Continue to work on daily meditation and relaxation exercises.

## 2022-05-05 NOTE — Assessment & Plan Note (Addendum)
Chronic, stable.  Initial BP elevated, just came from work.  Repeat at goal.  Tolerating this well without ADR.  Continue current medication regimen and adjust as needed.  Recommend she monitor BP at least a few mornings a week at home and document.  DASH diet at home.  Labs today: BMP and CBC.

## 2022-05-05 NOTE — Telephone Encounter (Signed)
Refilled 03/07/2022 #90 [id number] rf. - conformed by same pharmacy. Requested Prescriptions  Pending Prescriptions Disp Refills  . olmesartan (BENICAR) 5 MG tablet [Pharmacy Med Name: OLMESARTAN MEDOXOMIL 5 MG TAB] 180 tablet 1    Sig: Take 1 tablet (5 mg total) by mouth daily. If in one week you noticed blood pressure still >130/80 at home increase to two tablets (10 MG) by mouth daily.     Cardiovascular:  Angiotensin Receptor Blockers Passed - 05/03/2022  9:15 AM      Passed - Cr in normal range and within 180 days    Creatinine, Ser  Date Value Ref Range Status  03/07/2022 0.66 0.57 - 1.00 mg/dL Final         Passed - K in normal range and within 180 days    Potassium  Date Value Ref Range Status  03/07/2022 4.6 3.5 - 5.2 mmol/L Final         Passed - Patient is not pregnant      Passed - Last BP in normal range    BP Readings from Last 1 Encounters:  05/05/22 132/70         Passed - Valid encounter within last 6 months    Recent Outpatient Visits          Today Iron deficiency anemia due to chronic blood loss   [place], Barbaraann Faster, NP   1 month ago Essential hypertension   [place], [place] T, NP   3 months ago Essential hypertension   [place], [place] T, NP   9 months ago Elevated BP without diagnosis of hypertension   [facility] [place], Lometa T, NP   1 year ago Elevated BP without diagnosis of hypertension   [place], Barbaraann Faster, NP      Future Appointments            In 4 months [place], Barbaraann Faster, NP [facility] [facility]

## 2022-05-05 NOTE — Progress Notes (Signed)
BP 132/70 (BP Location: Left Arm, Patient Position: Sitting, Cuff Size: Large)   Pulse 92   Temp 98.2 F (36.8 C) (Oral)   Ht 5' 2.52" (1.588 m)   Wt 213 lb (96.6 kg)   LMP 04/07/2022   SpO2 99%   BMI 38.31 kg/m    Subjective:    Patient ID: Alexis Cordova, female    DOB: 26-Feb-1981, 41 y.o.   MRN: [id number]  HPI: Alexis Cordova is a 41 y.o. female  Chief Complaint  Patient presents with   Hypertension   Iron Levels   HYPERTENSION Taking Olmesartan 5 MG daily.  Works in stressful environment.   Hypertension status: stable  Satisfied with current treatment? yes Duration of hypertension: chronic BP monitoring frequency:  a few times a week BP range: unknown BP medication side effects:  no Medication compliance: good compliance Previous BP meds: Olmesartan Aspirin: no Recurrent headaches: no Visual changes: no Palpitations: no Dyspnea: no Chest pain: no Lower extremity edema: no Dizzy/lightheaded: no     05/05/2022   10:33 AM 03/07/2022    9:22 AM 01/30/2022    9:17 AM 03/29/2021    1:07 PM  Depression screen PHQ 2/9  Decreased Interest '1 1 2 '$ 0  Down, Depressed, Hopeless '1 1 2 '$ 0  PHQ - 2 Score '2 2 4 '$ 0  Altered sleeping 1 1 0   Tired, decreased energy 1 1 0   Change in appetite 1 0 1   Feeling bad or failure about yourself  '1 1 1   '$ Trouble concentrating 0 0 1   Moving slowly or fidgety/restless 0 0 0   Suicidal thoughts 0 0 0   PHQ-9 Score '6 5 7   '$ Difficult doing work/chores Somewhat difficult Not difficult at all Somewhat difficult        05/05/2022   10:33 AM 03/07/2022    9:22 AM 01/30/2022    9:18 AM  GAD 7 : Generalized Anxiety Score  Nervous, Anxious, on Edge '1 1 2  '$ Control/stop worrying '1 1 2  '$ Worry too much - different things '1 1 1  '$ Trouble relaxing '1 1 2  '$ Restless 0 0 0  Easily annoyed or irritable '1 1 2  '$ Afraid - awful might happen 0 1 0  Total GAD 7 Score '5 6 9  '$ Anxiety Difficulty Somewhat difficult Not difficult at all  Somewhat difficult     ANEMIA Noted on recent labs with levels noted below.  Started on Vitron daily.  She does have heavier menstrual cycles at baseline -- her cycles last 7 days, heavier for 4 days (pads only, has to change every 2 hours).  Does pass clots occasion.  28 to 32 days between cycles.  Did talk to Dr. Kenton Kingfisher in past with GYN about her cycles. Anemia status: uncontrolled Etiology of anemia: ?heavier cycles Duration of anemia treatment: >6 weeks Compliance with treatment: good compliance Iron supplementation side effects: no Severity of anemia: moderate Fatigue: no Decreased exercise tolerance: yes  Dyspnea on exertion: no Palpitations: no Bleeding:  with cycles Pica: no   Relevant past medical, surgical, family and social history reviewed and updated as indicated. Interim medical history since our last visit reviewed. Allergies and medications reviewed and updated.  Review of Systems  Constitutional:  Negative for activity change, appetite change, diaphoresis, fatigue and fever.  Respiratory:  Negative for cough, chest tightness and shortness of breath.   Cardiovascular:  Negative for chest pain, palpitations and leg swelling.  Gastrointestinal:  Negative.   Neurological: Negative.   Psychiatric/Behavioral: Negative.      Per HPI unless specifically indicated above     Objective:    BP 132/70 (BP Location: Left Arm, Patient Position: Sitting, Cuff Size: Large)   Pulse 92   Temp 98.2 F (36.8 C) (Oral)   Ht 5' 2.52" (1.588 m)   Wt 213 lb (96.6 kg)   LMP 04/07/2022   SpO2 99%   BMI 38.31 kg/m   Wt Readings from Last 3 Encounters:  05/05/22 213 lb (96.6 kg)  03/07/22 208 lb 12.8 oz (94.7 kg)  01/30/22 213 lb 9.6 oz (96.9 kg)    Physical Exam Vitals and nursing note reviewed.  Constitutional:      General: She is awake. She is not in acute distress.    Appearance: She is well-developed and well-groomed. She is obese. She is not ill-appearing or  toxic-appearing.  HENT:     Head: Normocephalic.  Eyes:     General:        Right eye: No discharge.        Left eye: No discharge.     Conjunctiva/sclera: Conjunctivae normal.     Pupils: Pupils are equal, round, and reactive to light.  Neck:     Thyroid: No thyromegaly.     Vascular: No carotid bruit.  Cardiovascular:     Rate and Rhythm: Normal rate and regular rhythm.     Heart sounds: Normal heart sounds.  Pulmonary:     Effort: Pulmonary effort is normal. No accessory muscle usage or respiratory distress.     Breath sounds: Normal breath sounds.  Abdominal:     General: Bowel sounds are normal.     Palpations: Abdomen is soft.  Musculoskeletal:     Right hand: Normal.     Left hand: Normal.     Cervical back: Normal range of motion and neck supple.  Lymphadenopathy:     Cervical: No cervical adenopathy.  Skin:    General: Skin is warm and dry.  Neurological:     Mental Status: She is alert and oriented to person, place, and time.     Sensory: Sensation is intact.  Psychiatric:        Attention and Perception: Attention normal.        Mood and Affect: Mood normal.        Speech: Speech normal.        Behavior: Behavior normal. Behavior is cooperative.        Thought Content: Thought content normal.    Results for orders placed or performed in visit on 03/21/22  Ferritin  Result Value Ref Range   Ferritin 7 (L) 15 - 150 ng/mL  Iron Binding Cap (TIBC)([facility])  Result Value Ref Range   Total Iron Binding Capacity 481 (H) 250 - 450 ug/dL   UIBC 461 (H) 131 - 425 ug/dL   Iron 20 (L) 27 - 159 ug/dL   Iron Saturation 4 (LL) 15 - 55 %  Vitamin B12  Result Value Ref Range   Vitamin B-12 465 232 - 1,245 pg/mL  Lipid Panel w/o Chol/HDL Ratio  Result Value Ref Range   Cholesterol, Total 210 (H) 100 - 199 mg/dL   Triglycerides 124 0 - 149 mg/dL   HDL 46 >39 mg/dL   VLDL Cholesterol Cal 22 5 - 40 mg/dL   LDL Chol Calc (NIH) 142 (H) 0 - 99 mg/dL  CBC with  Differential/Platelet  Result Value Ref Range  WBC 5.4 3.4 - 10.8 x10E3/uL   RBC 4.41 3.77 - 5.28 x10E6/uL   Hemoglobin 9.6 (L) 11.1 - 15.9 g/dL   Hematocrit 32.0 (L) 34.0 - 46.6 %   MCV 73 (L) 79 - 97 fL   MCH 21.8 (L) 26.6 - 33.0 pg   MCHC 30.0 (L) 31.5 - 35.7 g/dL   RDW 16.0 (H) 11.7 - 15.4 %   Platelets 393 150 - 450 x10E3/uL   Neutrophils 54 Not Estab. %   Lymphs 35 Not Estab. %   Monocytes 7 Not Estab. %   Eos 4 Not Estab. %   Basos 0 Not Estab. %   Neutrophils Absolute 2.9 1.4 - 7.0 x10E3/uL   Lymphocytes Absolute 1.9 0.7 - 3.1 x10E3/uL   Monocytes Absolute 0.4 0.1 - 0.9 x10E3/uL   EOS (ABSOLUTE) 0.2 0.0 - 0.4 x10E3/uL   Basophils Absolute 0.0 0.0 - 0.2 x10E3/uL   Immature Granulocytes 0 Not Estab. %   Immature Grans (Abs) 0.0 0.0 - 0.1 x10E3/uL      Assessment & Plan:   Problem List Items Addressed This Visit       Cardiovascular and Mediastinum   Essential hypertension    Chronic, stable.  Initial BP elevated, just came from work.  Repeat at goal.  Tolerating this well without ADR.  Continue current medication regimen and adjust as needed.  Recommend she monitor BP at least a few mornings a week at home and document.  DASH diet at home.  Labs today: BMP and CBC.       Relevant Medications   EPINEPHrine 0.3 mg/0.3 mL IJ SOAJ injection     Other   Iron deficiency anemia due to chronic blood loss - Primary    Noted on recent labs on 03/22/22, suspect from heavier menstrual cycles and history of fibroids.  May need to get her back into GYN if ongoing anemia.  Continue Vitron at this time, is tolerating well and taking daily.  Recheck CBC, iron, ferritin today.  Discussed with her if ongoing lows may get her into hematology.      Relevant Orders   CBC with Differential/Platelet   Basic metabolic panel   Iron Binding Cap (TIBC)([facility])   Ferritin   Obesity    BMI 38.31 -- working on weight loss.  Recommended eating smaller high protein, low fat meals more  frequently and exercising 30 mins a day 5 times a week with a goal of 10-15lb weight loss in the next 3 months. Patient voiced their understanding and motivation to adhere to these recommendations.       Situational anxiety    Due to work life, does not wish to start medication at this time.  Continue to work on daily meditation and relaxation exercises.        Follow up plan: Return for As scheduled 09/08/22 for HTN, HLD, ANEMIA.

## 2022-05-05 NOTE — Assessment & Plan Note (Signed)
BMI 38.31 -- working on weight loss.  Recommended eating smaller high protein, low fat meals more frequently and exercising 30 mins a day 5 times a week with a goal of 10-15lb weight loss in the next 3 months. Patient voiced their understanding and motivation to adhere to these recommendations.

## 2022-05-06 LAB — IRON AND TIBC
Iron Saturation: 25 % (ref 15–55)
Iron: 123 ug/dL (ref 27–159)
Total Iron Binding Capacity: 487 ug/dL — ABNORMAL HIGH (ref 250–450)
UIBC: 364 ug/dL (ref 131–425)

## 2022-05-06 LAB — CBC WITH DIFFERENTIAL/PLATELET
Basophils Absolute: 0 10*3/uL (ref 0.0–0.2)
Basos: 0 %
EOS (ABSOLUTE): 0.2 10*3/uL (ref 0.0–0.4)
Eos: 3 %
Hematocrit: 36.6 % (ref 34.0–46.6)
Hemoglobin: 11.3 g/dL (ref 11.1–15.9)
Immature Grans (Abs): 0 10*3/uL (ref 0.0–0.1)
Immature Granulocytes: 0 %
Lymphocytes Absolute: 2.1 10*3/uL (ref 0.7–3.1)
Lymphs: 31 %
MCH: 23.3 pg — ABNORMAL LOW (ref 26.6–33.0)
MCHC: 30.9 g/dL — ABNORMAL LOW (ref 31.5–35.7)
MCV: 76 fL — ABNORMAL LOW (ref 79–97)
Monocytes Absolute: 0.5 10*3/uL (ref 0.1–0.9)
Monocytes: 7 %
Neutrophils Absolute: 4 10*3/uL (ref 1.4–7.0)
Neutrophils: 59 %
Platelets: 353 10*3/uL (ref 150–450)
RBC: 4.84 x10E6/uL (ref 3.77–5.28)
RDW: 22.3 % — ABNORMAL HIGH (ref 11.7–15.4)
WBC: 6.9 10*3/uL (ref 3.4–10.8)

## 2022-05-06 LAB — BASIC METABOLIC PANEL
BUN/Creatinine Ratio: 14 (ref 9–23)
BUN: 9 mg/dL (ref 6–20)
CO2: 20 mmol/L (ref 20–29)
Calcium: 9.5 mg/dL (ref 8.7–10.2)
Chloride: 99 mmol/L (ref 96–106)
Creatinine, Ser: 0.63 mg/dL (ref 0.57–1.00)
Glucose: 100 mg/dL — ABNORMAL HIGH (ref 70–99)
Potassium: 4.3 mmol/L (ref 3.5–5.2)
Sodium: 136 mmol/L (ref 134–144)
eGFR: 116 mL/min/{1.73_m2} (ref >59)

## 2022-05-06 LAB — FERRITIN: Ferritin: 24 ng/mL (ref 15–150)

## 2022-05-06 NOTE — Progress Notes (Signed)
Contacted via MyChart   Good morning Alexis Cordova, labs are overall much improving.  Anemia with iron level much improved.  Would reduce iron to every other day and not every day at this time so we do not get levels too high.  Any questions? Keep being awesome!!  Thank you for allowing me to participate in your care.  I appreciate you. Kindest regards, Alioune Hodgkin

## 2022-09-08 ENCOUNTER — Ambulatory Visit: Payer: 59 | Admitting: Nurse Practitioner

## 2022-11-23 NOTE — Patient Instructions (Addendum)
Please call to schedule your mammogram and/or bone density: [facility]  Address: [address], [place], [place] [address] Phone: [phone]  [facility] [place] [address] [place],    [address] Phone: [phone]    DASH Eating Plan DASH stands for Dietary Approaches to Stop Hypertension. The DASH eating plan is a healthy eating plan that has been shown to: Reduce high blood pressure (hypertension). Reduce your risk for type 2 diabetes, heart disease, and stroke. Help with weight loss. What are tips for following this plan? Reading food labels Check food labels for the amount of salt (sodium) per serving. Choose foods with less than 5 percent of the Daily Value of sodium. Generally, foods with less than 300 milligrams (mg) of sodium per serving fit into this eating plan. To find whole grains, look for the word "whole" as the first word in the ingredient list. Shopping Buy products labeled as "low-sodium" or "no salt added." Buy fresh foods. Avoid canned foods and pre-made or frozen meals. Cooking Avoid adding salt when cooking. Use salt-free seasonings or herbs instead of table salt or sea salt. Check with your health care provider or pharmacist before using salt substitutes. Do not fry foods. Cook foods using healthy methods such as baking, boiling, grilling, roasting, and broiling instead. Cook with heart-healthy oils, such as olive, canola, avocado, soybean, or sunflower oil. Meal planning  Eat a balanced diet that includes: 4 or more servings of fruits and 4 or more servings of vegetables each day. Try to fill one-half of your plate with fruits and vegetables. 6-8 servings of whole grains each day. Less than 6 oz (170 g) of lean meat, poultry, or fish each day. A 3-oz (85-g) serving of meat is about the same size as a deck of cards. One egg equals 1 oz (28 g). 2-3 servings of low-fat dairy each  day. One serving is 1 cup (237 mL). 1 serving of nuts, seeds, or beans 5 times each week. 2-3 servings of heart-healthy fats. Healthy fats called omega-3 fatty acids are found in foods such as walnuts, flaxseeds, fortified milks, and eggs. These fats are also found in cold-water fish, such as sardines, salmon, and mackerel. Limit how much you eat of: Canned or prepackaged foods. Food that is high in trans fat, such as some fried foods. Food that is high in saturated fat, such as fatty meat. Desserts and other sweets, sugary drinks, and other foods with added sugar. Full-fat dairy products. Do not salt foods before eating. Do not eat more than 4 egg yolks a week. Try to eat at least 2 vegetarian meals a week. Eat more home-cooked food and less restaurant, buffet, and fast food. Lifestyle When eating at a restaurant, ask that your food be prepared with less salt or no salt, if possible. If you drink alcohol: Limit how much you use to: 0-1 drink a day for women who are not pregnant. 0-2 drinks a day for men. Be aware of how much alcohol is in your drink. In the [place] one drink equals one 12 oz bottle of beer (355 mL), one 5 oz glass of wine (148 mL), or one 1 oz glass of hard liquor (44 mL). General information Avoid eating more than 2,300 mg of salt a day. If you have hypertension, you may need to reduce your sodium intake to 1,500 mg a day. Work with your health care provider to maintain a healthy   body weight or to lose weight. Ask what an ideal weight is for you. Get at least 30 minutes of exercise that causes your heart to beat faster (aerobic exercise) most days of the week. Activities may include walking, swimming, or biking. Work with your health care provider or dietitian to adjust your eating plan to your individual calorie needs. What foods should I eat? Fruits All fresh, dried, or frozen fruit. Canned fruit in natural juice (without added sugar). Vegetables Fresh or frozen  vegetables (raw, steamed, roasted, or grilled). Low-sodium or reduced-sodium tomato and vegetable juice. Low-sodium or reduced-sodium tomato sauce and tomato paste. Low-sodium or reduced-sodium canned vegetables. Grains Whole-grain or whole-wheat bread. Whole-grain or whole-wheat pasta. Brown rice. Oatmeal. Quinoa. Bulgur. Whole-grain and low-sodium cereals. Pita bread. Low-fat, low-sodium crackers. Whole-wheat flour tortillas. Meats and other proteins Skinless chicken or turkey. Ground chicken or turkey. Pork with fat trimmed off. Fish and seafood. Egg whites. Dried beans, peas, or lentils. Unsalted nuts, nut butters, and seeds. Unsalted canned beans. Lean cuts of beef with fat trimmed off. Low-sodium, lean precooked or cured meat, such as sausages or meat loaves. Dairy Low-fat (1%) or fat-free (skim) milk. Reduced-fat, low-fat, or fat-free cheeses. Nonfat, low-sodium ricotta or cottage cheese. Low-fat or nonfat yogurt. Low-fat, low-sodium cheese. Fats and oils Soft margarine without trans fats. Vegetable oil. Reduced-fat, low-fat, or light mayonnaise and salad dressings (reduced-sodium). Canola, safflower, olive, avocado, soybean, and sunflower oils. Avocado. Seasonings and condiments Herbs. Spices. Seasoning mixes without salt. Other foods Unsalted popcorn and pretzels. Fat-free sweets. The items listed above may not be a complete list of foods and beverages you can eat. Contact a dietitian for more information. What foods should I avoid? Fruits Canned fruit in a light or heavy syrup. Fried fruit. Fruit in cream or butter sauce. Vegetables Creamed or fried vegetables. Vegetables in a cheese sauce. Regular canned vegetables (not low-sodium or reduced-sodium). Regular canned tomato sauce and paste (not low-sodium or reduced-sodium). Regular tomato and vegetable juice (not low-sodium or reduced-sodium). Pickles. Olives. Grains Baked goods made with fat, such as croissants, muffins, or some  breads. Dry pasta or rice meal packs. Meats and other proteins Fatty cuts of meat. Ribs. Fried meat. Bacon. Bologna, salami, and other precooked or cured meats, such as sausages or meat loaves. Fat from the back of a pig (fatback). Bratwurst. Salted nuts and seeds. Canned beans with added salt. Canned or smoked fish. Whole eggs or egg yolks. Chicken or turkey with skin. Dairy Whole or 2% milk, cream, and half-and-half. Whole or full-fat cream cheese. Whole-fat or sweetened yogurt. Full-fat cheese. Nondairy creamers. Whipped toppings. Processed cheese and cheese spreads. Fats and oils Butter. Stick margarine. Lard. Shortening. Ghee. Bacon fat. Tropical oils, such as coconut, palm kernel, or palm oil. Seasonings and condiments Onion salt, garlic salt, seasoned salt, table salt, and sea salt. Worcestershire sauce. Tartar sauce. Barbecue sauce. Teriyaki sauce. Soy sauce, including reduced-sodium. Steak sauce. Canned and packaged gravies. Fish sauce. Oyster sauce. Cocktail sauce. Store-bought horseradish. Ketchup. Mustard. Meat flavorings and tenderizers. Bouillon cubes. Hot sauces. Pre-made or packaged marinades. Pre-made or packaged taco seasonings. Relishes. Regular salad dressings. Other foods Salted popcorn and pretzels. The items listed above may not be a complete list of foods and beverages you should avoid. Contact a dietitian for more information. Where to find more information National Heart, Lung, and Blood Institute: www.nhlbi.nih.gov American Heart Association: www.heart.org Academy of Nutrition and Dietetics: www.eatright.org National Kidney Foundation: www.kidney.org Summary The DASH eating plan is a healthy eating   plan that has been shown to reduce high blood pressure (hypertension). It may also reduce your risk for type 2 diabetes, heart disease, and stroke. When on the DASH eating plan, aim to eat more fresh fruits and vegetables, whole grains, lean proteins, low-fat dairy, and  heart-healthy fats. With the DASH eating plan, you should limit salt (sodium) intake to 2,300 mg a day. If you have hypertension, you may need to reduce your sodium intake to 1,500 mg a day. Work with your health care provider or dietitian to adjust your eating plan to your individual calorie needs. This information is not intended to replace advice given to you by your health care provider. Make sure you discuss any questions you have with your health care provider. Document Revised: 06/03/2019 Document Reviewed: 06/03/2019 Elsevier Patient Education  2023 Elsevier Inc.  

## 2022-11-28 ENCOUNTER — Ambulatory Visit (INDEPENDENT_AMBULATORY_CARE_PROVIDER_SITE_OTHER): Payer: 59 | Admitting: Nurse Practitioner

## 2022-11-28 ENCOUNTER — Encounter: Payer: Self-pay | Admitting: Nurse Practitioner

## 2022-11-28 VITALS — BP 127/78 | HR 87 | Temp 97.8°F | Ht 62.52 in | Wt 213.0 lb

## 2022-11-28 DIAGNOSIS — E7800 Pure hypercholesterolemia, unspecified: Secondary | ICD-10-CM

## 2022-11-28 DIAGNOSIS — E6609 Other obesity due to excess calories: Secondary | ICD-10-CM

## 2022-11-28 DIAGNOSIS — E559 Vitamin D deficiency, unspecified: Secondary | ICD-10-CM | POA: Diagnosis not present

## 2022-11-28 DIAGNOSIS — Z1231 Encounter for screening mammogram for malignant neoplasm of breast: Secondary | ICD-10-CM | POA: Diagnosis not present

## 2022-11-28 DIAGNOSIS — Z6838 Body mass index (BMI) 38.0-38.9, adult: Secondary | ICD-10-CM | POA: Diagnosis not present

## 2022-11-28 DIAGNOSIS — I10 Essential (primary) hypertension: Secondary | ICD-10-CM

## 2022-11-28 DIAGNOSIS — Z0000 Encounter for general adult medical examination without abnormal findings: Secondary | ICD-10-CM

## 2022-11-28 DIAGNOSIS — F418 Other specified anxiety disorders: Secondary | ICD-10-CM

## 2022-11-28 DIAGNOSIS — D500 Iron deficiency anemia secondary to blood loss (chronic): Secondary | ICD-10-CM

## 2022-11-28 MED ORDER — OLMESARTAN MEDOXOMIL 5 MG PO TABS: 5.0000 mg | ORAL_TABLET | Freq: Every day | ORAL | 4 refills | Status: DC

## 2022-11-28 NOTE — Assessment & Plan Note (Signed)
Chronic, stable on recent labs with levels improved.  Has underlying fibroids and heavier cycles.  Check CBC, iron, B12, ferritin today.  Continue supplement every other day.  Consider hematology if any worsening in future + GYN.

## 2022-11-28 NOTE — Assessment & Plan Note (Signed)
BMI 38.31 -- working on weight loss.  Recommended eating smaller high protein, low fat meals more frequently and exercising 30 mins a day 5 times a week with a goal of 10-15lb weight loss in the next 3 months. Patient voiced their understanding and motivation to adhere to these recommendations.  

## 2022-11-28 NOTE — Assessment & Plan Note (Signed)
Noted on past labs, recommend heavy focus on diet and regular exercise.  Check Lipid panel today. 

## 2022-11-28 NOTE — Assessment & Plan Note (Signed)
Ongoing.  Noted on recent labs -- continue supplement and will recheck today. 

## 2022-11-28 NOTE — Progress Notes (Signed)
BP 127/78   Pulse 87   Temp 97.8 F (36.6 C) (Oral)   Ht 5' 2.52" (1.588 m)   Wt 213 lb (96.6 kg)   BMI 38.31 kg/m    Subjective:    Patient ID: Alexis Cordova, female    DOB: Sep 22, 1980, 42 y.o.   MRN: [id number]  HPI: Alexis Cordova is a 42 y.o. female presenting on 11/28/2022 for comprehensive medical examination. Current medical complaints include:none  She currently lives with: husband Menopausal Symptoms: no  HYPERTENSION  Continues on Olmesartan 5 MG daily. Hypertension status: stable  Satisfied with current treatment? yes Duration of hypertension: chronic BP monitoring frequency:  not checking BP range:  BP medication side effects:  no Medication compliance: good compliance Previous BP meds: Olmesartan Aspirin: no Recurrent headaches: no Visual changes: no Palpitations: no Dyspnea: no Chest pain: no Lower extremity edema: no Dizzy/lightheaded: no  The 10-year ASCVD risk score (Arnett DK, et al., 2019) is: 2.3%   Values used to calculate the score:     Age: 47 years     Sex: Female     Is Non-Hispanic African American: Yes     Diabetic: No     Tobacco smoker: No     Systolic Blood Pressure: 127 mmHg     Is BP treated: Yes     HDL Cholesterol: 46 mg/dL     Total Cholesterol: 210 mg/dL  ANEMIA Levels in October 2023 had improved. Continues supplement every other day.  Continues on Vitamin D daily for history of lows. Anemia status: controlled Etiology of anemia: heavier menstrual cycles -- saw Dr. Tiburcio Pea, looking for new GYN Duration of anemia treatment: months Compliance with treatment: good compliance Iron supplementation side effects: no Severity of anemia: moderate Fatigue: yes Decreased exercise tolerance: no  Dyspnea on exertion: no Palpitations: no Bleeding: no Pica: no      11/28/2022   11:22 AM 05/05/2022   10:33 AM 03/07/2022    9:22 AM 01/30/2022    9:17 AM 03/29/2021    1:07 PM  Depression screen PHQ 2/9  Decreased  Interest 1 1 1 2  0  Down, Depressed, Hopeless 1 1 1 2  0  PHQ - 2 Score 2 2 2 4  0  Altered sleeping 1 1 1  0   Tired, decreased energy 0 1 1 0   Change in appetite 0 1 0 1   Feeling bad or failure about yourself  0 1 1 1    Trouble concentrating 0 0 0 1   Moving slowly or fidgety/restless 0 0 0 0   Suicidal thoughts 0 0 0 0   PHQ-9 Score 3 6 5 7    Difficult doing work/chores Somewhat difficult Somewhat difficult Not difficult at all Somewhat difficult        11/28/2022   11:22 AM 05/05/2022   10:33 AM 03/07/2022    9:22 AM 01/30/2022    9:18 AM  GAD 7 : Generalized Anxiety Score  Nervous, Anxious, on Edge 0 1 1 2   Control/stop worrying 0 1 1 2   Worry too much - different things 1 1 1 1   Trouble relaxing 1 1 1 2   Restless 0 0 0 0  Easily annoyed or irritable 1 1 1 2   Afraid - awful might happen 0 0 1 0  Total GAD 7 Score 3 5 6 9   Anxiety Difficulty Not difficult at all Somewhat difficult Not difficult at all Somewhat difficult        07/20/2018  8:05 PM 07/21/2018    7:10 AM 01/30/2022    9:17 AM 03/07/2022    9:22 AM 11/28/2022   11:22 AM  Fall Risk  Falls in the past year?   0 0 0  Was there an injury with Fall?   0 0 0  Fall Risk Category Calculator   0 0 0  Fall Risk Category (Retired)   Low Low   (RETIRED) Patient Fall Risk Level   Low fall risk Low fall risk   Patient at Risk for Falls Due to   No Fall Risks No Fall Risks Other (Comment)  Fall risk Follow up   Falls evaluation completed Falls evaluation completed Falls evaluation completed     Information is confidential and restricted. Go to Review Flowsheets to unlock data.    Past Medical History:  Past Medical History:  Diagnosis Date   Anxiety    Dysmenorrhea    Fibroid    Pap smear abnormality of cervix with ASCUS favoring benign     Surgical History:  Past Surgical History:  Procedure Laterality Date   CESAREAN SECTION N/A 07/18/2018   Procedure: CESAREAN SECTION;  Surgeon: Nadara Mustard, MD;  Location:  [facility] ORS;  Service: Obstetrics;  Laterality: N/A;   DILATION AND CURETTAGE OF UTERUS     HERNIA REPAIR     Umbilical hernia   KNEE ARTHROSCOPY W/ MENISCAL REPAIR Left     Medications:  Current Outpatient Medications on File Prior to Visit  Medication Sig   cholecalciferol (VITAMIN D3) 25 MCG (1000 UNIT) tablet Take 1,000 Units by mouth daily.   EPINEPHrine 0.3 mg/0.3 mL IJ SOAJ injection Inject 0.3 mg into the muscle as needed for anaphylaxis.   ferrous sulfate (SLOW RELEASE IRON) 160 (50 Fe) MG TBCR SR tablet Take 160 mg by mouth every other day.   No current facility-administered medications on file prior to visit.    Allergies:  Allergies  Allergen Reactions   Shellfish Allergy     Just sick     Social History:  Social History   Socioeconomic History   Marital status: Married    Spouse name: Marquita Palms   Number of children: Not on file   Years of education: Not on file   Highest education level: Not on file  Occupational History   Not on file  Tobacco Use   Smoking status: Former   Smokeless tobacco: Never  Vaping Use   Vaping Use: Never used  Substance and Sexual Activity   Alcohol use: Yes    Comment: 4   Drug use: No   Sexual activity: Yes    Birth control/protection: None  Other Topics Concern   Not on file  Social History Narrative   Not on file   Social Determinants of Health   Financial Resource Strain: Low Risk  (03/29/2021)   Overall Financial Resource Strain (CARDIA)    Difficulty of Paying Living Expenses: Not hard at all  Food Insecurity: No Food Insecurity (03/29/2021)   Hunger Vital Sign    Worried About Running Out of Food in the Last Year: Never true    Ran Out of Food in the Last Year: Never true  Transportation Needs: No Transportation Needs (03/29/2021)   PRAPARE - Administrator, Civil Service (Medical): No    Lack of Transportation (Non-Medical): No  Physical Activity: Inactive (03/29/2021)   Exercise Vital Sign    Days of  Exercise per Week: 0 days    Minutes of  Exercise per Session: 0 min  Stress: No Stress Concern Present (03/29/2021)   [employer] of Occupational Health - Occupational Stress Questionnaire    Feeling of Stress : Only a little  Social Connections: Moderately Isolated (03/29/2021)   Social Connection and Isolation Panel [NHANES]    Frequency of Communication with Friends and Family: Twice a week    Frequency of Social Gatherings with Friends and Family: Twice a week    Attends Religious Services: Never    Database administrator or Organizations: No    Attends Banker Meetings: Never    Marital Status: Married  Catering manager Violence: Not At Risk (03/29/2021)   Humiliation, Afraid, Rape, and Kick questionnaire    Fear of Current or Ex-Partner: No    Emotionally Abused: No    Physically Abused: No    Sexually Abused: No   Social History   Tobacco Use  Smoking Status Former  Smokeless Tobacco Never   Social History   Substance and Sexual Activity  Alcohol Use Yes   Comment: 4    Family History:  Family History  Problem Relation Age of Onset   Hypertension Mother     Past medical history, surgical history, medications, allergies, family history and social history reviewed with patient today and changes made to appropriate areas of the chart.   ROS All other ROS negative except what is listed above and in the HPI.      Objective:    BP 127/78   Pulse 87   Temp 97.8 F (36.6 C) (Oral)   Ht 5' 2.52" (1.588 m)   Wt 213 lb (96.6 kg)   BMI 38.31 kg/m   Wt Readings from Last 3 Encounters:  11/28/22 213 lb (96.6 kg)  05/05/22 213 lb (96.6 kg)  03/07/22 208 lb 12.8 oz (94.7 kg)    Physical Exam Vitals and nursing note reviewed. Exam conducted with a chaperone present.  Constitutional:      General: She is awake. She is not in acute distress.    Appearance: She is well-developed and well-groomed. She is obese. She is not ill-appearing or  toxic-appearing.  HENT:     Head: Normocephalic and atraumatic.     Right Ear: Hearing, tympanic membrane, ear canal and external ear normal. No drainage.     Left Ear: Hearing, tympanic membrane, ear canal and external ear normal. No drainage.     Nose: Nose normal.     Right Sinus: No maxillary sinus tenderness or frontal sinus tenderness.     Left Sinus: No maxillary sinus tenderness or frontal sinus tenderness.     Mouth/Throat:     Mouth: Mucous membranes are moist.     Pharynx: Oropharynx is clear. Uvula midline. No pharyngeal swelling, oropharyngeal exudate or posterior oropharyngeal erythema.  Eyes:     General: Lids are normal.        Right eye: No discharge.        Left eye: No discharge.     Extraocular Movements: Extraocular movements intact.     Conjunctiva/sclera: Conjunctivae normal.     Pupils: Pupils are equal, round, and reactive to light.     Visual Fields: Right eye visual fields normal and left eye visual fields normal.  Neck:     Thyroid: No thyromegaly.     Vascular: No carotid bruit.     Trachea: Trachea normal.  Cardiovascular:     Rate and Rhythm: Normal rate and regular rhythm.  Heart sounds: Normal heart sounds. No murmur heard.    No gallop.  Pulmonary:     Effort: Pulmonary effort is normal. No accessory muscle usage or respiratory distress.     Breath sounds: Normal breath sounds.  Chest:  Breasts:    Right: Normal.     Left: Normal.  Abdominal:     General: Bowel sounds are normal.     Palpations: Abdomen is soft. There is no hepatomegaly or splenomegaly.     Tenderness: There is no abdominal tenderness.  Musculoskeletal:        General: Normal range of motion.     Cervical back: Normal range of motion and neck supple.     Right lower leg: No edema.     Left lower leg: No edema.  Lymphadenopathy:     Head:     Right side of head: No submental, submandibular, tonsillar, preauricular or posterior auricular adenopathy.     Left side of  head: No submental, submandibular, tonsillar, preauricular or posterior auricular adenopathy.     Cervical: No cervical adenopathy.     Upper Body:     Right upper body: No supraclavicular, axillary or pectoral adenopathy.     Left upper body: No supraclavicular, axillary or pectoral adenopathy.  Skin:    General: Skin is warm and dry.     Capillary Refill: Capillary refill takes less than 2 seconds.     Findings: No rash.  Neurological:     Mental Status: She is alert and oriented to person, place, and time.     Gait: Gait is intact.     Deep Tendon Reflexes: Reflexes are normal and symmetric.     Reflex Scores:      Brachioradialis reflexes are 2+ on the right side and 2+ on the left side.      Patellar reflexes are 2+ on the right side and 2+ on the left side. Psychiatric:        Attention and Perception: Attention normal.        Mood and Affect: Mood normal.        Speech: Speech normal.        Behavior: Behavior normal. Behavior is cooperative.        Thought Content: Thought content normal.        Judgment: Judgment normal.    Results for orders placed or performed in visit on 05/05/22  CBC with Differential/Platelet  Result Value Ref Range   WBC 6.9 3.4 - 10.8 x10E3/uL   RBC 4.84 3.77 - 5.28 x10E6/uL   Hemoglobin 11.3 11.1 - 15.9 g/dL   Hematocrit 16.1 09.6 - 46.6 %   MCV 76 (L) 79 - 97 fL   MCH 23.3 (L) 26.6 - 33.0 pg   MCHC 30.9 (L) 31.5 - 35.7 g/dL   RDW 04.5 (H) 40.9 - 81.1 %   Platelets 353 150 - 450 x10E3/uL   Neutrophils 59 Not Estab. %   Lymphs 31 Not Estab. %   Monocytes 7 Not Estab. %   Eos 3 Not Estab. %   Basos 0 Not Estab. %   Neutrophils Absolute 4.0 1.4 - 7.0 x10E3/uL   Lymphocytes Absolute 2.1 0.7 - 3.1 x10E3/uL   Monocytes Absolute 0.5 0.1 - 0.9 x10E3/uL   EOS (ABSOLUTE) 0.2 0.0 - 0.4 x10E3/uL   Basophils Absolute 0.0 0.0 - 0.2 x10E3/uL   Immature Granulocytes 0 Not Estab. %   Immature Grans (Abs) 0.0 0.0 - 0.1 x10E3/uL   Hematology Comments:  Note:   Basic metabolic panel  Result Value Ref Range   Glucose 100 (H) 70 - 99 mg/dL   BUN 9 6 - 20 mg/dL   Creatinine, Ser 1.61 0.57 - 1.00 mg/dL   eGFR 096 >04 VW/UJW/1.19   BUN/Creatinine Ratio 14 9 - 23   Sodium 136 134 - 144 mmol/L   Potassium 4.3 3.5 - 5.2 mmol/L   Chloride 99 96 - 106 mmol/L   CO2 20 20 - 29 mmol/L   Calcium 9.5 8.7 - 10.2 mg/dL  Iron Binding Cap (TIBC)([facility])  Result Value Ref Range   Total Iron Binding Capacity 487 (H) 250 - 450 ug/dL   UIBC 147 829 - 562 ug/dL   Iron 130 27 - 865 ug/dL   Iron Saturation 25 15 - 55 %  Ferritin  Result Value Ref Range   Ferritin 24 15 - 150 ng/mL      Assessment & Plan:   Problem List Items Addressed This Visit       Cardiovascular and Mediastinum   Essential hypertension - Primary    Chronic, stable.  BP at goal today in office, has changed positions.  Continue current medication regimen and adjust as needed.  Recommend she monitor BP at least a few mornings a week at home and document.  DASH diet at home.  Labs today: CBC, CMP, TSH.  Refills sent in.      Relevant Medications   olmesartan (BENICAR) 5 MG tablet   Other Relevant Orders   Comprehensive metabolic panel   TSH     Other   Elevated low density lipoprotein (LDL) cholesterol level    Noted on past labs, recommend heavy focus on diet and regular exercise.  Check Lipid panel today.      Relevant Orders   Comprehensive metabolic panel   Lipid Panel w/o Chol/HDL Ratio   Iron deficiency anemia due to chronic blood loss    Chronic, stable on recent labs with levels improved.  Has underlying fibroids and heavier cycles.  Check CBC, iron, B12, ferritin today.  Continue supplement every other day.  Consider hematology if any worsening in future + GYN.      Relevant Medications   ferrous sulfate (SLOW RELEASE IRON) 160 (50 Fe) MG TBCR SR tablet   Other Relevant Orders   CBC with Differential/Platelet   Iron Binding Cap  (TIBC)([facility])   Ferritin   Vitamin B12   Obesity    BMI 38.31 -- working on weight loss.  Recommended eating smaller high protein, low fat meals more frequently and exercising 30 mins a day 5 times a week with a goal of 10-15lb weight loss in the next 3 months. Patient voiced their understanding and motivation to adhere to these recommendations.       Vitamin D deficiency    Ongoing.  Noted on recent labs -- continue supplement and will recheck today.      Relevant Orders   VITAMIN D 25 Hydroxy (Vit-D Deficiency, Fractures)   Other Visit Diagnoses     Encounter for screening mammogram for malignant neoplasm of breast       Mammogram ordered today.   Relevant Orders   MM 3D SCREENING MAMMOGRAM BILATERAL BREAST   Encounter for annual physical exam       Annual physical today, health maintenance reviewed with patient and will initiate mammograms.        Follow up plan: Return in about 6 months (around 05/31/2023) for HTN AND ANEMIA.  LABORATORY TESTING:  - Pap smear: up to date  IMMUNIZATIONS:   - Tdap: Tetanus vaccination status reviewed: last tetanus booster within 10 years. - Influenza: Up to date - Pneumovax: Not applicable - Prevnar: Not applicable - COVID: Refused - HPV: Not applicable - Shingrix vaccine: Not applicable  SCREENING: -Mammogram: Ordered today  - Colonoscopy: Not applicable  - Bone Density: Not applicable  -Hearing Test: Not applicable  -Spirometry: Not applicable   PATIENT COUNSELING:   Advised to take 1 mg of folate supplement per day if capable of pregnancy.   Sexuality: Discussed sexually transmitted diseases, partner selection, use of condoms, avoidance of unintended pregnancy  and contraceptive alternatives.   Advised to avoid cigarette smoking.  I discussed with the patient that most people either abstain from alcohol or drink within safe limits (<=14/week and <=4 drinks/occasion for males, <=7/weeks and <= 3 drinks/occasion  for females) and that the risk for alcohol disorders and other health effects rises proportionally with the number of drinks per week and how often a drinker exceeds daily limits.  Discussed cessation/primary prevention of drug use and availability of treatment for abuse.   Diet: Encouraged to adjust caloric intake to maintain  or achieve ideal body weight, to reduce intake of dietary saturated fat and total fat, to limit sodium intake by avoiding high sodium foods and not adding table salt, and to maintain adequate dietary potassium and calcium preferably from fresh fruits, vegetables, and low-fat dairy products.    Stressed the importance of regular exercise  Injury prevention: Discussed safety belts, safety helmets, smoke detector, smoking near bedding or upholstery.   Dental health: Discussed importance of regular tooth brushing, flossing, and dental visits.    NEXT PREVENTATIVE PHYSICAL DUE IN 1 YEAR. Return in about 6 months (around 05/31/2023) for HTN AND ANEMIA.

## 2022-11-28 NOTE — Assessment & Plan Note (Signed)
Chronic, stable.  BP at goal today in office, has changed positions.  Continue current medication regimen and adjust as needed.  Recommend she monitor BP at least a few mornings a week at home and document.  DASH diet at home.  Labs today: CBC, CMP, TSH.  Refills sent in.

## 2022-11-29 LAB — CBC WITH DIFFERENTIAL/PLATELET
Basophils Absolute: 0 10*3/uL (ref 0.0–0.2)
Basos: 1 %
EOS (ABSOLUTE): 0.1 10*3/uL (ref 0.0–0.4)
Eos: 2 %
Hematocrit: 35.8 % (ref 34.0–46.6)
Hemoglobin: 10.9 g/dL — ABNORMAL LOW (ref 11.1–15.9)
Immature Grans (Abs): 0 10*3/uL (ref 0.0–0.1)
Immature Granulocytes: 1 %
Lymphocytes Absolute: 2.1 10*3/uL (ref 0.7–3.1)
Lymphs: 32 %
MCH: 22.7 pg — ABNORMAL LOW (ref 26.6–33.0)
MCHC: 30.4 g/dL — ABNORMAL LOW (ref 31.5–35.7)
MCV: 75 fL — ABNORMAL LOW (ref 79–97)
Monocytes Absolute: 0.5 10*3/uL (ref 0.1–0.9)
Monocytes: 8 %
Neutrophils Absolute: 3.6 10*3/uL (ref 1.4–7.0)
Neutrophils: 56 %
Platelets: 387 10*3/uL (ref 150–450)
RBC: 4.8 x10E6/uL (ref 3.77–5.28)
RDW: 16.6 % — ABNORMAL HIGH (ref 11.7–15.4)
WBC: 6.5 10*3/uL (ref 3.4–10.8)

## 2022-11-29 LAB — COMPREHENSIVE METABOLIC PANEL
ALT: 28 IU/L (ref 0–32)
AST: 32 IU/L (ref 0–40)
Albumin/Globulin Ratio: 1.7 (ref 1.2–2.2)
Albumin: 4.5 g/dL (ref 3.9–4.9)
Alkaline Phosphatase: 56 IU/L (ref 44–121)
BUN/Creatinine Ratio: 12 (ref 9–23)
BUN: 8 mg/dL (ref 6–24)
Bilirubin Total: 0.3 mg/dL (ref 0.0–1.2)
CO2: 22 mmol/L (ref 20–29)
Calcium: 9.7 mg/dL (ref 8.7–10.2)
Chloride: 97 mmol/L (ref 96–106)
Creatinine, Ser: 0.65 mg/dL (ref 0.57–1.00)
Globulin, Total: 2.7 g/dL (ref 1.5–4.5)
Glucose: 91 mg/dL (ref 70–99)
Potassium: 4.2 mmol/L (ref 3.5–5.2)
Sodium: 135 mmol/L (ref 134–144)
Total Protein: 7.2 g/dL (ref 6.0–8.5)
eGFR: 114 mL/min/{1.73_m2} (ref >59)

## 2022-11-29 LAB — LIPID PANEL W/O CHOL/HDL RATIO
Cholesterol, Total: 210 mg/dL — ABNORMAL HIGH (ref 100–199)
HDL: 48 mg/dL (ref >39)
LDL Chol Calc (NIH): 136 mg/dL — ABNORMAL HIGH (ref 0–99)
Triglycerides: 144 mg/dL (ref 0–149)
VLDL Cholesterol Cal: 26 mg/dL (ref 5–40)

## 2022-11-29 LAB — IRON AND TIBC
Iron Saturation: 5 % — CL (ref 15–55)
Iron: 27 ug/dL (ref 27–159)
Total Iron Binding Capacity: 500 ug/dL — ABNORMAL HIGH (ref 250–450)
UIBC: 473 ug/dL — ABNORMAL HIGH (ref 131–425)

## 2022-11-29 LAB — VITAMIN B12: Vitamin B-12: 394 pg/mL (ref 232–1245)

## 2022-11-29 LAB — TSH: TSH: 1.75 u[IU]/mL (ref 0.450–4.500)

## 2022-11-29 LAB — FERRITIN: Ferritin: 9 ng/mL — ABNORMAL LOW (ref 15–150)

## 2022-11-29 LAB — VITAMIN D 25 HYDROXY (VIT D DEFICIENCY, FRACTURES): Vit D, 25-Hydroxy: 42.2 ng/mL (ref 30.0–100.0)

## 2022-11-29 NOTE — Progress Notes (Signed)
Contacted via MyChart   Good afternoon Irais, your labs have returned: - I recommend going back to every day Slow Fe (iron) supplement as levels have decreased quite a bit on every other day -- return to every day dosing.  Iron dropped from 123 to 27 now, we need to boost it up a little.  Hemoglobin also a little low. - Kidney function, creatinine and eGFR, remains normal, as is liver function, AST and ALT.  - Thyroid level normal.  Vitamin D and B12 normal. - Your cholesterol is still high, but continued recommendations to make lifestyle changes. Your LDL is above normal. The LDL is the bad cholesterol. Over time and in combination with inflammation and other factors, this contributes to plaque which in turn may lead to stroke and/or heart attack down the road. Sometimes high LDL is primarily genetic, and people might be eating all the right foods but still have high numbers. Other times, there is room for improvement in one's diet and eating healthier can bring this number down and potentially reduce one's risk of heart attack and/or stroke.   To reduce your LDL, Remember - more fruits and vegetables, more fish, and limit red meat and dairy products. More soy, nuts, beans, barley, lentils, oats and plant sterol ester enriched margarine instead of butter. I also encourage eliminating sugar and processed food. Remember, shop on the outside of the grocery store and visit your [employer]. If you would like to talk with me about dietary changes for your cholesterol, please let me know. We should recheck your cholesterol in 6 months.  Any questions? Keep being amazing!!  Thank you for allowing me to participate in your care.  I appreciate you. Kindest regards, Liany Mumpower

## 2023-01-07 ENCOUNTER — Encounter: Payer: Self-pay | Admitting: Radiology

## 2023-01-07 ENCOUNTER — Ambulatory Visit
Admission: RE | Admit: 2023-01-07 | Discharge: 2023-01-07 | Disposition: A | Discharge status: Home / Self Care | Payer: 59 | Source: Ambulatory Visit | Attending: Nurse Practitioner | Admitting: Nurse Practitioner

## 2023-01-07 DIAGNOSIS — Z1231 Encounter for screening mammogram for malignant neoplasm of breast: Secondary | ICD-10-CM | POA: Insufficient documentation

## 2023-01-12 ENCOUNTER — Other Ambulatory Visit: Payer: Self-pay | Admitting: Nurse Practitioner

## 2023-01-12 DIAGNOSIS — R928 Other abnormal and inconclusive findings on diagnostic imaging of breast: Secondary | ICD-10-CM

## 2023-01-12 DIAGNOSIS — N63 Unspecified lump in unspecified breast: Secondary | ICD-10-CM

## 2023-01-12 NOTE — Progress Notes (Signed)
Contacted via MyChart   Good morning Ivie, your mammogram returned and they need to do further imaging, please contact. Please call to schedule your mammogram and/or bone density: [facility]  Address: [address], [place], [place] [address] Phone: [phone]  [facility] [address] [place],  [place]  [address] Phone: [phone]

## 2023-01-14 ENCOUNTER — Ambulatory Visit
Admission: RE | Admit: 2023-01-14 | Discharge: 2023-01-14 | Disposition: A | Discharge status: Home / Self Care | Payer: 59 | Source: Ambulatory Visit | Attending: Nurse Practitioner | Admitting: Nurse Practitioner

## 2023-01-14 DIAGNOSIS — N6310 Unspecified lump in the right breast, unspecified quadrant: Secondary | ICD-10-CM | POA: Diagnosis not present

## 2023-01-14 DIAGNOSIS — R928 Other abnormal and inconclusive findings on diagnostic imaging of breast: Secondary | ICD-10-CM

## 2023-01-14 DIAGNOSIS — N63 Unspecified lump in unspecified breast: Secondary | ICD-10-CM | POA: Diagnosis not present

## 2023-01-14 DIAGNOSIS — R92321 Mammographic fibroglandular density, right breast: Secondary | ICD-10-CM | POA: Diagnosis not present

## 2023-05-12 ENCOUNTER — Ambulatory Visit: Payer: Self-pay | Admitting: Physician Assistant

## 2023-05-12 ENCOUNTER — Encounter: Payer: Self-pay | Admitting: Physician Assistant

## 2023-05-12 DIAGNOSIS — H6993 Unspecified Eustachian tube disorder, bilateral: Secondary | ICD-10-CM

## 2023-05-12 MED ORDER — BENZONATATE 100 MG PO CAPS: 100.0000 mg | ORAL_CAPSULE | Freq: Three times a day (TID) | ORAL | 0 refills | Status: DC | PRN

## 2023-05-12 MED ORDER — METHYLPREDNISOLONE 4 MG PO TBPK: ORAL_TABLET | ORAL | 0 refills | Status: DC

## 2023-05-12 MED ORDER — CLARITIN-D 12 HOUR 5-120 MG PO TB12
1.0000 | ORAL_TABLET | Freq: Two times a day (BID) | ORAL | 0 refills | Status: DC
Start: 2023-05-12 — End: 2023-06-01

## 2023-05-12 NOTE — Progress Notes (Signed)
Pt presents today with pressure in  both ears x 6days.

## 2023-05-12 NOTE — Progress Notes (Signed)
   Subjective: Bilateral ear pressure/pain    Patient ID: Alexis Cordova, female    DOB: 1981/04/05, 42 y.o.   MRN: [id number]  HPI Patient complains of 3 days of bilateral ear pressure.  Patient denies hearing loss or vertigo.  Also complaining of nonproductive cough.  Patient states complaint started after extending working outdoors doing yard work few days ago.  No palliative measure for complaint.   Review of Systems Hyperlipidemia,Hypertension, and vitamin D deficiency.    Objective:   Physical Exam HEENT is remarkable bilateral edematous TM. Neck is supple for lymphadenopathy or bruits. Lungs are clear to auscultation. Heart is regular rate and rhythm.       Assessment & Plan: Eustachian tube dysfunction  Patient given a prescription for Claritin-D, Medrol Dosepak, and Tessalon Perles.  Advised to follow-up if no improvement or worsening complaint.

## 2023-05-26 NOTE — Progress Notes (Signed)
Alexis Cordova called [facility] today requesting an ENT Referral.  States still having ear pressure - worse in left ear.  States one day last week she had relief for about a day & then it came back.  Has seen Dr. Andee Poles in the past, but willing to see anyone at [facility].    Referral to [facility] put in Epic.  AMD

## 2023-05-26 NOTE — Addendum Note (Signed)
Addended by: Gardner Candle on: 05/26/2023 11:05 AM   Modules accepted: Orders

## 2023-05-31 NOTE — Patient Instructions (Incomplete)
Be Involved in Caring For Your Health:  Taking Medications When medications are taken as directed, they can greatly improve your health. But if they are not taken as prescribed, they may not work. In some cases, not taking them correctly can be harmful. To help ensure your treatment remains effective and safe, understand your medications and how to take them. Bring your medications to each visit for review by your provider.  Your lab results, notes, and after visit summary will be available on My Chart. We strongly encourage you to use this feature. If lab results are abnormal the clinic will contact you with the appropriate steps. If the clinic does not contact you assume the results are satisfactory. You can always view your results on My Chart. If you have questions regarding your health or results, please contact the clinic during office hours. You can also ask questions on My Chart.  We at [facility] are grateful that you chose [place] to provide your care. We strive to provide evidence-based and compassionate care and are always looking for feedback. If you get a survey from the clinic please complete this so we can hear your opinions.  Heart-Healthy Eating Plan Many factors influence your heart health, including eating and exercise habits. Heart health is also called coronary health. Coronary risk increases with abnormal blood fat (lipid) levels. A heart-healthy eating plan includes limiting unhealthy fats, increasing healthy fats, limiting salt (sodium) intake, and making other diet and lifestyle changes. What is my plan? Your health care provider may recommend that: You limit your fat intake to _________% or less of your total calories each day. You limit your saturated fat intake to _________% or less of your total calories each day. You limit the amount of cholesterol in your diet to less than _________ mg per day. You limit the amount of sodium in your diet to less than _________  mg per day. What are tips for following this plan? Cooking Cook foods using methods other than frying. Baking, boiling, grilling, and broiling are all good options. Other ways to reduce fat include: Removing the skin from poultry. Removing all visible fats from meats. Steaming vegetables in water or broth. Meal planning  At meals, imagine dividing your plate into fourths: Fill one-half of your plate with vegetables and green salads. Fill one-fourth of your plate with whole grains. Fill one-fourth of your plate with lean protein foods. Eat 2-4 cups of vegetables per day. One cup of vegetables equals 1 cup (91 g) broccoli or cauliflower florets, 2 medium carrots, 1 large bell pepper, 1 large sweet potato, 1 large tomato, 1 medium white potato, 2 cups (150 g) raw leafy greens. Eat 1-2 cups of fruit per day. One cup of fruit equals 1 small apple, 1 large banana, 1 cup (237 g) mixed fruit, 1 large orange,  cup (82 g) dried fruit, 1 cup (240 mL) 100% fruit juice. Eat more foods that contain soluble fiber. Examples include apples, broccoli, carrots, beans, peas, and barley. Aim to get 25-30 g of fiber per day. Increase your consumption of legumes, nuts, and seeds to 4-5 servings per week. One serving of dried beans or legumes equals  cup (90 g) cooked, 1 serving of nuts is  oz (12 almonds, 24 pistachios, or 7 walnut halves), and 1 serving of seeds equals  oz (8 g). Fats Choose healthy fats more often. Choose monounsaturated and polyunsaturated fats, such as olive and canola oils, avocado oil, flaxseeds, walnuts, almonds, and seeds. Eat  more omega-3 fats. Choose salmon, mackerel, sardines, tuna, flaxseed oil, and ground flaxseeds. Aim to eat fish at least 2 times each week. Check food labels carefully to identify foods with trans fats or high amounts of saturated fat. Limit saturated fats. These are found in animal products, such as meats, butter, and cream. Plant sources of saturated fats  include palm oil, palm kernel oil, and coconut oil. Avoid foods with partially hydrogenated oils in them. These contain trans fats. Examples are stick margarine, some tub margarines, cookies, crackers, and other baked goods. Avoid fried foods. General information Eat more home-cooked food and less restaurant, buffet, and fast food. Limit or avoid alcohol. Limit foods that are high in added sugar and simple starches such as foods made using white refined flour (white breads, pastries, sweets). Lose weight if you are overweight. Losing just 5-10% of your body weight can help your overall health and prevent diseases such as diabetes and heart disease. Monitor your sodium intake, especially if you have high blood pressure. Talk with your health care provider about your sodium intake. Try to incorporate more vegetarian meals weekly. What foods should I eat? Fruits All fresh, canned (in natural juice), or frozen fruits. Vegetables Fresh or frozen vegetables (raw, steamed, roasted, or grilled). Green salads. Grains Most grains. Choose whole wheat and whole grains most of the time. Rice and pasta, including brown rice and pastas made with whole wheat. Meats and other proteins Lean, well-trimmed beef, veal, pork, and lamb. Chicken and [place] without skin. All fish and shellfish. Wild duck, rabbit, pheasant, and venison. Egg whites or low-cholesterol egg substitutes. Dried beans, peas, lentils, and tofu. Seeds and most nuts. Dairy Low-fat or nonfat cheeses, including ricotta and mozzarella. Skim or 1% milk (liquid, powdered, or evaporated). Buttermilk made with low-fat milk. Nonfat or low-fat yogurt. Fats and oils Non-hydrogenated (trans-free) margarines. Vegetable oils, including soybean, sesame, sunflower, olive, avocado, peanut, safflower, corn, canola, and cottonseed. Salad dressings or mayonnaise made with a vegetable oil. Beverages Water (mineral or sparkling). Coffee and tea. Unsweetened ice  tea. Diet beverages. Sweets and desserts Sherbet, gelatin, and fruit ice. Small amounts of dark chocolate. Limit all sweets and desserts. Seasonings and condiments All seasonings and condiments. The items listed above may not be a complete list of foods and beverages you can eat. Contact a dietitian for more options. What foods should I avoid? Fruits Canned fruit in heavy syrup. Fruit in cream or butter sauce. Fried fruit. Limit coconut. Vegetables Vegetables cooked in cheese, cream, or butter sauce. Fried vegetables. Grains Breads made with saturated or trans fats, oils, or whole milk. Croissants. Sweet rolls. Donuts. High-fat crackers, such as cheese crackers and chips. Meats and other proteins Fatty meats, such as hot dogs, ribs, sausage, bacon, rib-eye roast or steak. High-fat deli meats, such as salami and bologna. Caviar. Domestic duck and goose. Organ meats, such as liver. Dairy Cream, sour cream, cream cheese, and creamed cottage cheese. Whole-milk cheeses. Whole or 2% milk (liquid, evaporated, or condensed). Whole buttermilk. Cream sauce or high-fat cheese sauce. Whole-milk yogurt. Fats and oils Meat fat, or shortening. Cocoa butter, hydrogenated oils, palm oil, coconut oil, palm kernel oil. Solid fats and shortenings, including bacon fat, salt pork, lard, and butter. Nondairy cream substitutes. Salad dressings with cheese or sour cream. Beverages Regular sodas and any drinks with added sugar. Sweets and desserts Frosting. Pudding. Cookies. Cakes. Pies. Milk chocolate or white chocolate. Buttered syrups. Full-fat ice cream or ice cream drinks. The items listed above may  not be a complete list of foods and beverages to avoid. Contact a dietitian for more information. Summary Heart-healthy meal planning includes limiting unhealthy fats, increasing healthy fats, limiting salt (sodium) intake and making other diet and lifestyle changes. Lose weight if you are overweight. Losing just  5-10% of your body weight can help your overall health and prevent diseases such as diabetes and heart disease. Focus on eating a balance of foods, including fruits and vegetables, low-fat or nonfat dairy, lean protein, nuts and legumes, whole grains, and heart-healthy oils and fats. This information is not intended to replace advice given to you by your health care provider. Make sure you discuss any questions you have with your health care provider. Document Revised: 08/05/2021 Document Reviewed: 08/05/2021 Elsevier Patient Education  2024 [employer].

## 2023-06-01 ENCOUNTER — Encounter: Payer: Self-pay | Admitting: Nurse Practitioner

## 2023-06-01 ENCOUNTER — Ambulatory Visit (INDEPENDENT_AMBULATORY_CARE_PROVIDER_SITE_OTHER): Payer: 59 | Admitting: Nurse Practitioner

## 2023-06-01 VITALS — BP 126/82 | HR 102 | Temp 99.0°F | Ht 62.5 in | Wt 205.0 lb

## 2023-06-01 DIAGNOSIS — E66812 Obesity, class 2: Secondary | ICD-10-CM | POA: Diagnosis not present

## 2023-06-01 DIAGNOSIS — E6609 Other obesity due to excess calories: Secondary | ICD-10-CM | POA: Diagnosis not present

## 2023-06-01 DIAGNOSIS — I10 Essential (primary) hypertension: Secondary | ICD-10-CM | POA: Diagnosis not present

## 2023-06-01 DIAGNOSIS — D219 Benign neoplasm of connective and other soft tissue, unspecified: Secondary | ICD-10-CM | POA: Diagnosis not present

## 2023-06-01 DIAGNOSIS — E7800 Pure hypercholesterolemia, unspecified: Secondary | ICD-10-CM | POA: Diagnosis not present

## 2023-06-01 DIAGNOSIS — Z23 Encounter for immunization: Secondary | ICD-10-CM

## 2023-06-01 DIAGNOSIS — D500 Iron deficiency anemia secondary to blood loss (chronic): Secondary | ICD-10-CM | POA: Diagnosis not present

## 2023-06-01 DIAGNOSIS — Z6838 Body mass index (BMI) 38.0-38.9, adult: Secondary | ICD-10-CM | POA: Diagnosis not present

## 2023-06-01 DIAGNOSIS — E559 Vitamin D deficiency, unspecified: Secondary | ICD-10-CM

## 2023-06-01 DIAGNOSIS — H6993 Unspecified Eustachian tube disorder, bilateral: Secondary | ICD-10-CM | POA: Insufficient documentation

## 2023-06-01 NOTE — Assessment & Plan Note (Signed)
Chronic, stable.  BP at goal on recheck today.  Continue current medication regimen and adjust as needed.  Recommend she monitor BP at least a few mornings a week at home and document.  DASH diet at home.  Labs today: CMP, CBC.  Educated her on medications not to take, nothing over the counter that has a D on it, ie. Claritin D.  Can take plain OTC antihistamines or Coricidin.

## 2023-06-01 NOTE — Assessment & Plan Note (Signed)
With heavier cycles and iron deficiency anemia.  Referral to GYN.

## 2023-06-01 NOTE — Assessment & Plan Note (Signed)
Related to allergies, always this time of year.  Is scheduled to see ENT.  Recommend she only take plain OTC antihistamines, none with D.  At this time trial OTC Xyzal (has not used this yet) and Flonase -- every day.  Determine next steps after sees ENT.

## 2023-06-01 NOTE — Assessment & Plan Note (Signed)
Noted on past labs, recommend heavy focus on diet and regular exercise.  Check lipid panel today. She has been focused on diet and is losing weight.

## 2023-06-01 NOTE — Assessment & Plan Note (Signed)
BMI 36.90 -- working on weight loss and has lost 8 lbs.  Recommended eating smaller high protein, low fat meals more frequently and exercising 30 mins a day 5 times a week with a goal of 10-15lb weight loss in the next 3 months. Patient voiced their understanding and motivation to adhere to these recommendations.

## 2023-06-01 NOTE — Assessment & Plan Note (Signed)
Chronic, stable on recent labs with levels improving.  Has underlying fibroids and heavier cycles.  Check CBC, iron, ferritin today.  Continue supplement every other day.  Consider hematology if any worsening in future.  Referral to GYN to discussed fibroids and heavier cycles.

## 2023-06-01 NOTE — Progress Notes (Signed)
BP 126/82 (BP Location: Left Arm, Patient Position: Sitting)   Pulse (!) 102   Temp 99 F (37.2 C) (Oral)   Ht 5' 2.5" (1.588 m)   Wt 205 lb (93 kg)   LMP 05/16/2023 (Exact Date)   SpO2 98%   BMI 36.90 kg/m    Subjective:    Patient ID: Alexis Cordova, female    DOB: 30-Sep-1980, 42 y.o.   MRN: [id number]  HPI: Alexis Cordova is a 42 y.o. female  Chief Complaint  Patient presents with   Anemia   Hypertension   Saw [facility] [place] for eustachian tube dysfunction.  Has issues with sinuses.  Gone to ENT before, scheduled to see them in one month.  Has used nasal sprays and OTC medications before without much benefit.  Cough has improved, but ears are still stuffed - slowly improving but not 100%.  HYPERTENSION without Chronic Kidney Disease Continues on Olmesartan 5 MG daily.  Has been working on activity and diet, lost 8 lbs.  Eating less at meals with intermittent fasting. Hypertension status: stable  Satisfied with current treatment? yes Duration of hypertension: chronic BP monitoring frequency:  not checking BP range:  BP medication side effects:  no Medication compliance: good compliance Aspirin: no Recurrent headaches: no Visual changes: no Palpitations: no Dyspnea: no Chest pain: no Lower extremity edema: no Dizzy/lightheaded: no  The 10-year ASCVD risk score (Arnett DK, et al., 2019) is: 2%   Values used to calculate the score:     Age: 31 years     Sex: Female     Is Non-Hispanic African American: Yes     Diabetic: No     Tobacco smoker: No     Systolic Blood Pressure: 126 mmHg     Is BP treated: Yes     HDL Cholesterol: 48 mg/dL     Total Cholesterol: 210 mg/dL  ANEMIA Continues on Slow Fe for anemia, suspect related to heavier cycles and fibroids -- however last two have not been as heavy.  Taking Vitamin D for history of low levels. Anemia status: stable Etiology of anemia: heavier cycles Duration of anemia treatment:  months Compliance with treatment: good compliance Iron supplementation side effects: no Severity of anemia: moderate Fatigue: no Decreased exercise tolerance: no  Dyspnea on exertion: no Palpitations: no Bleeding: no Pica: no   Relevant past medical, surgical, family and social history reviewed and updated as indicated. Interim medical history since our last visit reviewed. Allergies and medications reviewed and updated.  Review of Systems  Constitutional:  Negative for activity change, appetite change, diaphoresis, fatigue and fever.  HENT:  Negative for ear discharge and ear pain.   Respiratory:  Negative for cough, chest tightness and shortness of breath.   Cardiovascular:  Negative for chest pain, palpitations and leg swelling.  Gastrointestinal: Negative.   Neurological: Negative.   Psychiatric/Behavioral: Negative.      Per HPI unless specifically indicated above     Objective:    BP 126/82 (BP Location: Left Arm, Patient Position: Sitting)   Pulse (!) 102   Temp 99 F (37.2 C) (Oral)   Ht 5' 2.5" (1.588 m)   Wt 205 lb (93 kg)   LMP 05/16/2023 (Exact Date)   SpO2 98%   BMI 36.90 kg/m   Wt Readings from Last 3 Encounters:  06/01/23 205 lb (93 kg)  11/28/22 213 lb (96.6 kg)  05/05/22 213 lb (96.6 kg)    Physical Exam Vitals and nursing  note reviewed.  Constitutional:      General: She is awake. She is not in acute distress.    Appearance: She is well-developed and well-groomed. She is obese. She is not ill-appearing or toxic-appearing.  HENT:     Head: Normocephalic.     Right Ear: Hearing, ear canal and external ear normal. A middle ear effusion is present. Tympanic membrane is not injected.     Left Ear: Hearing, ear canal and external ear normal. A middle ear effusion is present. Tympanic membrane is not injected.     Nose: Nose normal.     Right Sinus: No maxillary sinus tenderness or frontal sinus tenderness.     Left Sinus: No maxillary sinus  tenderness or frontal sinus tenderness.     Mouth/Throat:     Mouth: Mucous membranes are moist.     Pharynx: Posterior oropharyngeal erythema (mild cobblestoning) present. No pharyngeal swelling or oropharyngeal exudate.  Eyes:     General: Lids are normal.        Right eye: No discharge.        Left eye: No discharge.     Conjunctiva/sclera: Conjunctivae normal.     Pupils: Pupils are equal, round, and reactive to light.  Neck:     Thyroid: No thyromegaly.     Vascular: No carotid bruit.  Cardiovascular:     Rate and Rhythm: Normal rate and regular rhythm.     Heart sounds: Normal heart sounds. No murmur heard.    No gallop.  Pulmonary:     Effort: Pulmonary effort is normal. No accessory muscle usage or respiratory distress.     Breath sounds: Normal breath sounds.  Abdominal:     General: Bowel sounds are normal. There is no distension.     Palpations: Abdomen is soft.     Tenderness: There is no abdominal tenderness.  Musculoskeletal:     Cervical back: Normal range of motion and neck supple.     Right lower leg: No edema.     Left lower leg: No edema.  Lymphadenopathy:     Cervical: No cervical adenopathy.  Skin:    General: Skin is warm and dry.  Neurological:     Mental Status: She is alert and oriented to person, place, and time.     Deep Tendon Reflexes: Reflexes are normal and symmetric.     Reflex Scores:      Brachioradialis reflexes are 2+ on the right side and 2+ on the left side.      Patellar reflexes are 2+ on the right side and 2+ on the left side. Psychiatric:        Attention and Perception: Attention normal.        Mood and Affect: Mood normal.        Speech: Speech normal.        Behavior: Behavior normal. Behavior is cooperative.        Thought Content: Thought content normal.     Results for orders placed or performed in visit on 11/28/22  CBC with Differential/Platelet  Result Value Ref Range   WBC 6.5 3.4 - 10.8 x10E3/uL   RBC 4.80 3.77 -  5.28 x10E6/uL   Hemoglobin 10.9 (L) 11.1 - 15.9 g/dL   Hematocrit 72.5 36.6 - 46.6 %   MCV 75 (L) 79 - 97 fL   MCH 22.7 (L) 26.6 - 33.0 pg   MCHC 30.4 (L) 31.5 - 35.7 g/dL   RDW 44.0 (H) 34.7 - 42.5 %  Platelets 387 150 - 450 x10E3/uL   Neutrophils 56 Not Estab. %   Lymphs 32 Not Estab. %   Monocytes 8 Not Estab. %   Eos 2 Not Estab. %   Basos 1 Not Estab. %   Neutrophils Absolute 3.6 1.4 - 7.0 x10E3/uL   Lymphocytes Absolute 2.1 0.7 - 3.1 x10E3/uL   Monocytes Absolute 0.5 0.1 - 0.9 x10E3/uL   EOS (ABSOLUTE) 0.1 0.0 - 0.4 x10E3/uL   Basophils Absolute 0.0 0.0 - 0.2 x10E3/uL   Immature Granulocytes 1 Not Estab. %   Immature Grans (Abs) 0.0 0.0 - 0.1 x10E3/uL  Comprehensive metabolic panel  Result Value Ref Range   Glucose 91 70 - 99 mg/dL   BUN 8 6 - 24 mg/dL   Creatinine, Ser 0.98 0.57 - 1.00 mg/dL   eGFR 119 >14 NW/GNF/6.21   BUN/Creatinine Ratio 12 9 - 23   Sodium 135 134 - 144 mmol/L   Potassium 4.2 3.5 - 5.2 mmol/L   Chloride 97 96 - 106 mmol/L   CO2 22 20 - 29 mmol/L   Calcium 9.7 8.7 - 10.2 mg/dL   Total Protein 7.2 6.0 - 8.5 g/dL   Albumin 4.5 3.9 - 4.9 g/dL   Globulin, Total 2.7 1.5 - 4.5 g/dL   Albumin/Globulin Ratio 1.7 1.2 - 2.2   Bilirubin Total 0.3 0.0 - 1.2 mg/dL   Alkaline Phosphatase 56 44 - 121 IU/L   AST 32 0 - 40 IU/L   ALT 28 0 - 32 IU/L  TSH  Result Value Ref Range   TSH 1.750 0.450 - 4.500 uIU/mL  Lipid Panel w/o Chol/HDL Ratio  Result Value Ref Range   Cholesterol, Total 210 (H) 100 - 199 mg/dL   Triglycerides 308 0 - 149 mg/dL   HDL 48 >65 mg/dL   VLDL Cholesterol Cal 26 5 - 40 mg/dL   LDL Chol Calc (NIH) 784 (H) 0 - 99 mg/dL  VITAMIN D 25 Hydroxy (Vit-D Deficiency, Fractures)  Result Value Ref Range   Vit D, 25-Hydroxy 42.2 30.0 - 100.0 ng/mL  Iron Binding Cap (TIBC)([facility])  Result Value Ref Range   Total Iron Binding Capacity 500 (H) 250 - 450 ug/dL   UIBC 696 (H) 295 - 284 ug/dL   Iron 27 27 - 132 ug/dL   Iron Saturation  5 (LL) 15 - 55 %  Ferritin  Result Value Ref Range   Ferritin 9 (L) 15 - 150 ng/mL  Vitamin B12  Result Value Ref Range   Vitamin B-12 394 232 - 1,245 pg/mL      Assessment & Plan:   Problem List Items Addressed This Visit       Cardiovascular and Mediastinum   Essential hypertension - Primary    Chronic, stable.  BP at goal on recheck today.  Continue current medication regimen and adjust as needed.  Recommend she monitor BP at least a few mornings a week at home and document.  DASH diet at home.  Labs today: CMP, CBC.  Educated her on medications not to take, nothing over the counter that has a D on it, ie. Claritin D.  Can take plain OTC antihistamines or Coricidin.      Relevant Orders   CBC with Differential/Platelet   Comprehensive metabolic panel     Nervous and Auditory   Eustachian tube disorder, bilateral    Related to allergies, always this time of year.  Is scheduled to see ENT.  Recommend she only take plain OTC antihistamines,  none with D.  At this time trial OTC Xyzal (has not used this yet) and Flonase -- every day.  Determine next steps after sees ENT.          Other   Elevated low density lipoprotein (LDL) cholesterol level    Noted on past labs, recommend heavy focus on diet and regular exercise.  Check lipid panel today. She has been focused on diet and is losing weight.      Relevant Orders   Comprehensive metabolic panel   Lipid Panel w/o Chol/HDL Ratio   Fibroids    With heavier cycles and iron deficiency anemia.  Referral to GYN.      Relevant Orders   Ambulatory referral to Gynecology   Iron deficiency anemia due to chronic blood loss    Chronic, stable on recent labs with levels improving.  Has underlying fibroids and heavier cycles.  Check CBC, iron, ferritin today.  Continue supplement every other day.  Consider hematology if any worsening in future.  Referral to GYN to discussed fibroids and heavier cycles.      Relevant Orders   CBC with  Differential/Platelet   Ferritin   Iron   Ambulatory referral to Gynecology   Obesity    BMI 36.90 -- working on weight loss and has lost 8 lbs.  Recommended eating smaller high protein, low fat meals more frequently and exercising 30 mins a day 5 times a week with a goal of 10-15lb weight loss in the next 3 months. Patient voiced their understanding and motivation to adhere to these recommendations.       Vitamin D deficiency    Ongoing.  Noted on recent labs -- continue supplement and will recheck today.      Relevant Orders   VITAMIN D 25 Hydroxy (Vit-D Deficiency, Fractures)     Follow up plan: Return in about 6 months (around 11/29/2023) for Annual Physical after 11/28/23.

## 2023-06-01 NOTE — Assessment & Plan Note (Signed)
Ongoing.  Noted on recent labs -- continue supplement and will recheck today.

## 2023-06-02 LAB — COMPREHENSIVE METABOLIC PANEL
ALT: 35 [IU]/L — ABNORMAL HIGH (ref 0–32)
AST: 38 [IU]/L (ref 0–40)
Albumin: 4.2 g/dL (ref 3.9–4.9)
Alkaline Phosphatase: 59 [IU]/L (ref 44–121)
BUN/Creatinine Ratio: 11 (ref 9–23)
BUN: 7 mg/dL (ref 6–24)
Bilirubin Total: 0.3 mg/dL (ref 0.0–1.2)
CO2: 21 mmol/L (ref 20–29)
Calcium: 9.1 mg/dL (ref 8.7–10.2)
Chloride: 99 mmol/L (ref 96–106)
Creatinine, Ser: 0.63 mg/dL (ref 0.57–1.00)
Globulin, Total: 3.1 g/dL (ref 1.5–4.5)
Glucose: 90 mg/dL (ref 70–99)
Potassium: 4.3 mmol/L (ref 3.5–5.2)
Sodium: 137 mmol/L (ref 134–144)
Total Protein: 7.3 g/dL (ref 6.0–8.5)
eGFR: 115 mL/min/{1.73_m2} (ref >59)

## 2023-06-02 LAB — LIPID PANEL W/O CHOL/HDL RATIO
Cholesterol, Total: 203 mg/dL — ABNORMAL HIGH (ref 100–199)
HDL: 40 mg/dL (ref >39)
LDL Chol Calc (NIH): 135 mg/dL — ABNORMAL HIGH (ref 0–99)
Triglycerides: 156 mg/dL — ABNORMAL HIGH (ref 0–149)
VLDL Cholesterol Cal: 28 mg/dL (ref 5–40)

## 2023-06-02 LAB — CBC WITH DIFFERENTIAL/PLATELET
Basophils Absolute: 0 10*3/uL (ref 0.0–0.2)
Basos: 0 %
EOS (ABSOLUTE): 0.3 10*3/uL (ref 0.0–0.4)
Eos: 4 %
Hematocrit: 33 % — ABNORMAL LOW (ref 34.0–46.6)
Hemoglobin: 9.3 g/dL — ABNORMAL LOW (ref 11.1–15.9)
Immature Grans (Abs): 0.1 10*3/uL (ref 0.0–0.1)
Immature Granulocytes: 1 %
Lymphocytes Absolute: 2.3 10*3/uL (ref 0.7–3.1)
Lymphs: 29 %
MCH: 20.2 pg — ABNORMAL LOW (ref 26.6–33.0)
MCHC: 28.2 g/dL — ABNORMAL LOW (ref 31.5–35.7)
MCV: 72 fL — ABNORMAL LOW (ref 79–97)
Monocytes Absolute: 0.6 10*3/uL (ref 0.1–0.9)
Monocytes: 8 %
Neutrophils Absolute: 4.6 10*3/uL (ref 1.4–7.0)
Neutrophils: 58 %
Platelets: 436 10*3/uL (ref 150–450)
RBC: 4.61 x10E6/uL (ref 3.77–5.28)
RDW: 20 % — ABNORMAL HIGH (ref 11.7–15.4)
WBC: 7.9 10*3/uL (ref 3.4–10.8)

## 2023-06-02 LAB — IRON: Iron: 36 ug/dL (ref 27–159)

## 2023-06-02 LAB — FERRITIN: Ferritin: 17 ng/mL (ref 15–150)

## 2023-06-02 LAB — VITAMIN D 25 HYDROXY (VIT D DEFICIENCY, FRACTURES): Vit D, 25-Hydroxy: 35 ng/mL (ref 30.0–100.0)

## 2023-06-02 NOTE — Progress Notes (Signed)
Contacted via MyChart -- please check to ensure she has received by calling.   Good day Cheria, your labs have returned: - CBC is showing ongoing anemia, some decline this time, and iron is on lower side of normal.  I do recommend a referral to hematology to further assess and receive possible iron infusions.  We will also see what GYN recommends to help with heavier cycles.  Are you okay with hematology referral? - Kidney function, creatinine and eGFR, remains stable. as is liver function, AST and ALT.  Only very mild elevation in ALT. - Lipid panel continues to show elevations.  No medications needed, but continue focus on healthy diet changes and regular activity.  Any questions? Keep being stellar!!  Thank you for allowing me to participate in your care.  I appreciate you. Kindest regards, Kirtan Sada

## 2023-06-25 DIAGNOSIS — N920 Excessive and frequent menstruation with regular cycle: Secondary | ICD-10-CM | POA: Diagnosis not present

## 2023-06-25 DIAGNOSIS — Z86018 Personal history of other benign neoplasm: Secondary | ICD-10-CM | POA: Diagnosis not present

## 2023-06-25 DIAGNOSIS — N944 Primary dysmenorrhea: Secondary | ICD-10-CM | POA: Diagnosis not present

## 2023-07-02 DIAGNOSIS — H6063 Unspecified chronic otitis externa, bilateral: Secondary | ICD-10-CM | POA: Diagnosis not present

## 2023-07-02 DIAGNOSIS — J309 Allergic rhinitis, unspecified: Secondary | ICD-10-CM | POA: Diagnosis not present

## 2023-07-02 DIAGNOSIS — J301 Allergic rhinitis due to pollen: Secondary | ICD-10-CM | POA: Diagnosis not present

## 2023-07-02 DIAGNOSIS — H6983 Other specified disorders of Eustachian tube, bilateral: Secondary | ICD-10-CM | POA: Diagnosis not present

## 2023-07-27 DIAGNOSIS — H6983 Other specified disorders of Eustachian tube, bilateral: Secondary | ICD-10-CM | POA: Diagnosis not present

## 2023-07-27 DIAGNOSIS — H6063 Unspecified chronic otitis externa, bilateral: Secondary | ICD-10-CM | POA: Diagnosis not present

## 2023-07-27 DIAGNOSIS — J301 Allergic rhinitis due to pollen: Secondary | ICD-10-CM | POA: Diagnosis not present

## 2023-08-11 DIAGNOSIS — N944 Primary dysmenorrhea: Secondary | ICD-10-CM | POA: Diagnosis not present

## 2023-08-11 DIAGNOSIS — Z86018 Personal history of other benign neoplasm: Secondary | ICD-10-CM | POA: Diagnosis not present

## 2023-08-11 DIAGNOSIS — N920 Excessive and frequent menstruation with regular cycle: Secondary | ICD-10-CM | POA: Diagnosis not present

## 2023-11-28 NOTE — Patient Instructions (Signed)
 Be Involved in Caring For Your Health:  Taking Medications When medications are taken as directed, they can greatly improve your health. But if they are not taken as prescribed, they may not work. In some cases, not taking them correctly can be harmful. To help ensure your treatment remains effective and safe, understand your medications and how to take them. Bring your medications to each visit for review by your provider.  Your lab results, notes, and after visit summary will be available on My Chart. We strongly encourage you to use this feature. If lab results are abnormal the clinic will contact you with the appropriate steps. If the clinic does not contact you assume the results are satisfactory. You can always view your results on My Chart. If you have questions regarding your health or results, please contact the clinic during office hours. You can also ask questions on My Chart.  We at [facility] are grateful that you chose [place] to provide your care. We strive to provide evidence-based and compassionate care and are always looking for feedback. If you get a survey from the clinic please complete this so we can hear your opinions.  Heart-Healthy Eating Plan Many factors influence your heart health, including eating and exercise habits. Heart health is also called coronary health. Coronary risk increases with abnormal blood fat (lipid) levels. A heart-healthy eating plan includes limiting unhealthy fats, increasing healthy fats, limiting salt (sodium) intake, and making other diet and lifestyle changes. What is my plan? Your health care provider may recommend that: You limit your fat intake to _________% or less of your total calories each day. You limit your saturated fat intake to _________% or less of your total calories each day. You limit the amount of cholesterol in your diet to less than _________ mg per day. You limit the amount of sodium in your diet to less than _________  mg per day. What are tips for following this plan? Cooking Cook foods using methods other than frying. Baking, boiling, grilling, and broiling are all good options. Other ways to reduce fat include: Removing the skin from poultry. Removing all visible fats from meats. Steaming vegetables in water or broth. Meal planning  At meals, imagine dividing your plate into fourths: Fill one-half of your plate with vegetables and green salads. Fill one-fourth of your plate with whole grains. Fill one-fourth of your plate with lean protein foods. Eat 2-4 cups of vegetables per day. One cup of vegetables equals 1 cup (91 g) broccoli or cauliflower florets, 2 medium carrots, 1 large bell pepper, 1 large sweet potato, 1 large tomato, 1 medium white potato, 2 cups (150 g) raw leafy greens. Eat 1-2 cups of fruit per day. One cup of fruit equals 1 small apple, 1 large banana, 1 cup (237 g) mixed fruit, 1 large orange,  cup (82 g) dried fruit, 1 cup (240 mL) 100% fruit juice. Eat more foods that contain soluble fiber. Examples include apples, broccoli, carrots, beans, peas, and barley. Aim to get 25-30 g of fiber per day. Increase your consumption of legumes, nuts, and seeds to 4-5 servings per week. One serving of dried beans or legumes equals  cup (90 g) cooked, 1 serving of nuts is  oz (12 almonds, 24 pistachios, or 7 walnut halves), and 1 serving of seeds equals  oz (8 g). Fats Choose healthy fats more often. Choose monounsaturated and polyunsaturated fats, such as olive and canola oils, avocado oil, flaxseeds, walnuts, almonds, and seeds. Eat  more omega-3 fats. Choose salmon, mackerel, sardines, tuna, flaxseed oil, and ground flaxseeds. Aim to eat fish at least 2 times each week. Check food labels carefully to identify foods with trans fats or high amounts of saturated fat. Limit saturated fats. These are found in animal products, such as meats, butter, and cream. Plant sources of saturated fats  include palm oil, palm kernel oil, and coconut oil. Avoid foods with partially hydrogenated oils in them. These contain trans fats. Examples are stick margarine, some tub margarines, cookies, crackers, and other baked goods. Avoid fried foods. General information Eat more home-cooked food and less restaurant, buffet, and fast food. Limit or avoid alcohol. Limit foods that are high in added sugar and simple starches such as foods made using white refined flour (white breads, pastries, sweets). Lose weight if you are overweight. Losing just 5-10% of your body weight can help your overall health and prevent diseases such as diabetes and heart disease. Monitor your sodium intake, especially if you have high blood pressure. Talk with your health care provider about your sodium intake. Try to incorporate more vegetarian meals weekly. What foods should I eat? Fruits All fresh, canned (in natural juice), or frozen fruits. Vegetables Fresh or frozen vegetables (raw, steamed, roasted, or grilled). Green salads. Grains Most grains. Choose whole wheat and whole grains most of the time. Rice and pasta, including brown rice and pastas made with whole wheat. Meats and other proteins Lean, well-trimmed beef, veal, pork, and lamb. Chicken and [place] without skin. All fish and shellfish. Wild duck, rabbit, pheasant, and venison. Egg whites or low-cholesterol egg substitutes. Dried beans, peas, lentils, and tofu. Seeds and most nuts. Dairy Low-fat or nonfat cheeses, including ricotta and mozzarella. Skim or 1% milk (liquid, powdered, or evaporated). Buttermilk made with low-fat milk. Nonfat or low-fat yogurt. Fats and oils Non-hydrogenated (trans-free) margarines. Vegetable oils, including soybean, sesame, sunflower, olive, avocado, peanut, safflower, corn, canola, and cottonseed. Salad dressings or mayonnaise made with a vegetable oil. Beverages Water (mineral or sparkling). Coffee and tea. Unsweetened ice  tea. Diet beverages. Sweets and desserts Sherbet, gelatin, and fruit ice. Small amounts of dark chocolate. Limit all sweets and desserts. Seasonings and condiments All seasonings and condiments. The items listed above may not be a complete list of foods and beverages you can eat. Contact a dietitian for more options. What foods should I avoid? Fruits Canned fruit in heavy syrup. Fruit in cream or butter sauce. Fried fruit. Limit coconut. Vegetables Vegetables cooked in cheese, cream, or butter sauce. Fried vegetables. Grains Breads made with saturated or trans fats, oils, or whole milk. Croissants. Sweet rolls. Donuts. High-fat crackers, such as cheese crackers and chips. Meats and other proteins Fatty meats, such as hot dogs, ribs, sausage, bacon, rib-eye roast or steak. High-fat deli meats, such as salami and bologna. Caviar. Domestic duck and goose. Organ meats, such as liver. Dairy Cream, sour cream, cream cheese, and creamed cottage cheese. Whole-milk cheeses. Whole or 2% milk (liquid, evaporated, or condensed). Whole buttermilk. Cream sauce or high-fat cheese sauce. Whole-milk yogurt. Fats and oils Meat fat, or shortening. Cocoa butter, hydrogenated oils, palm oil, coconut oil, palm kernel oil. Solid fats and shortenings, including bacon fat, salt pork, lard, and butter. Nondairy cream substitutes. Salad dressings with cheese or sour cream. Beverages Regular sodas and any drinks with added sugar. Sweets and desserts Frosting. Pudding. Cookies. Cakes. Pies. Milk chocolate or white chocolate. Buttered syrups. Full-fat ice cream or ice cream drinks. The items listed above may  not be a complete list of foods and beverages to avoid. Contact a dietitian for more information. Summary Heart-healthy meal planning includes limiting unhealthy fats, increasing healthy fats, limiting salt (sodium) intake and making other diet and lifestyle changes. Lose weight if you are overweight. Losing just  5-10% of your body weight can help your overall health and prevent diseases such as diabetes and heart disease. Focus on eating a balance of foods, including fruits and vegetables, low-fat or nonfat dairy, lean protein, nuts and legumes, whole grains, and heart-healthy oils and fats. This information is not intended to replace advice given to you by your health care provider. Make sure you discuss any questions you have with your health care provider. Document Revised: 08/05/2021 Document Reviewed: 08/05/2021 [employer] Patient Education  2024 [employer].

## 2023-11-30 ENCOUNTER — Ambulatory Visit (INDEPENDENT_AMBULATORY_CARE_PROVIDER_SITE_OTHER): Payer: Self-pay | Admitting: Nurse Practitioner

## 2023-11-30 ENCOUNTER — Encounter: Payer: Self-pay | Admitting: Nurse Practitioner

## 2023-11-30 ENCOUNTER — Other Ambulatory Visit (HOSPITAL_COMMUNITY)
Admission: RE | Admit: 2023-11-30 | Discharge: 2023-11-30 | Disposition: A | Discharge status: Home / Self Care | Source: Ambulatory Visit | Attending: Nurse Practitioner | Admitting: Nurse Practitioner

## 2023-11-30 VITALS — BP 126/82 | HR 96 | Temp 98.2°F | Ht 62.0 in | Wt 203.8 lb

## 2023-11-30 DIAGNOSIS — E559 Vitamin D deficiency, unspecified: Secondary | ICD-10-CM

## 2023-11-30 DIAGNOSIS — Z6838 Body mass index (BMI) 38.0-38.9, adult: Secondary | ICD-10-CM | POA: Diagnosis not present

## 2023-11-30 DIAGNOSIS — D500 Iron deficiency anemia secondary to blood loss (chronic): Secondary | ICD-10-CM | POA: Diagnosis not present

## 2023-11-30 DIAGNOSIS — E66812 Obesity, class 2: Secondary | ICD-10-CM

## 2023-11-30 DIAGNOSIS — E7800 Pure hypercholesterolemia, unspecified: Secondary | ICD-10-CM | POA: Diagnosis not present

## 2023-11-30 DIAGNOSIS — I10 Essential (primary) hypertension: Secondary | ICD-10-CM | POA: Diagnosis not present

## 2023-11-30 DIAGNOSIS — Z124 Encounter for screening for malignant neoplasm of cervix: Secondary | ICD-10-CM

## 2023-11-30 DIAGNOSIS — Z0000 Encounter for general adult medical examination without abnormal findings: Secondary | ICD-10-CM | POA: Diagnosis not present

## 2023-11-30 DIAGNOSIS — E6609 Other obesity due to excess calories: Secondary | ICD-10-CM | POA: Diagnosis not present

## 2023-11-30 MED ORDER — OLMESARTAN MEDOXOMIL 5 MG PO TABS: 5.0000 mg | ORAL_TABLET | Freq: Every day | ORAL | 4 refills | Status: AC

## 2023-11-30 NOTE — Assessment & Plan Note (Signed)
 Noted on past labs, recommend heavy focus on diet and regular exercise.  Check lipid panel today. She continues to lose weight.

## 2023-11-30 NOTE — Assessment & Plan Note (Signed)
 BMI 37.28.  Recommended eating smaller high protein, low fat meals more frequently and exercising 30 mins a day 5 times a week with a goal of 10-15lb weight loss in the next 3 months. Patient voiced their understanding and motivation to adhere to these recommendations.
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
## 2023-11-30 NOTE — Assessment & Plan Note (Signed)
Ongoing.  Noted on recent labs -- continue supplement and will recheck today.
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
## 2023-11-30 NOTE — Assessment & Plan Note (Addendum)
 Chronic, stable.  Has underlying fibroids and heavier cycles.  Check CBC, iron , ferritin today.  Continue supplement daily.  Consider hematology if any worsening in future.  Continue collaboration with GYN.

## 2023-12-01 ENCOUNTER — Ambulatory Visit: Payer: Self-pay | Admitting: Nurse Practitioner

## 2023-12-01 ENCOUNTER — Encounter: Payer: Self-pay | Admitting: Nurse Practitioner

## 2023-12-01 LAB — VITAMIN D 25 HYDROXY (VIT D DEFICIENCY, FRACTURES): Vit D, 25-Hydroxy: 36.5 ng/mL (ref 30.0–100.0)

## 2023-12-01 LAB — COMPREHENSIVE METABOLIC PANEL WITH GFR
ALT: 50 IU/L — ABNORMAL HIGH (ref 0–32)
AST: 61 IU/L — ABNORMAL HIGH (ref 0–40)
Albumin: 4.7 g/dL (ref 3.9–4.9)
Alkaline Phosphatase: 58 IU/L (ref 44–121)
BUN/Creatinine Ratio: 14 (ref 9–23)
BUN: 9 mg/dL (ref 6–24)
Bilirubin Total: 0.3 mg/dL (ref 0.0–1.2)
CO2: 20 mmol/L (ref 20–29)
Calcium: 9.6 mg/dL (ref 8.7–10.2)
Chloride: 98 mmol/L (ref 96–106)
Creatinine, Ser: 0.65 mg/dL (ref 0.57–1.00)
Globulin, Total: 2.7 g/dL (ref 1.5–4.5)
Glucose: 96 mg/dL (ref 70–99)
Potassium: 4.1 mmol/L (ref 3.5–5.2)
Sodium: 136 mmol/L (ref 134–144)
Total Protein: 7.4 g/dL (ref 6.0–8.5)
eGFR: 113 mL/min/{1.73_m2} (ref >59)

## 2023-12-01 LAB — LIPID PANEL W/O CHOL/HDL RATIO
Cholesterol, Total: 209 mg/dL — ABNORMAL HIGH (ref 100–199)
HDL: 47 mg/dL (ref >39)
LDL Chol Calc (NIH): 135 mg/dL — ABNORMAL HIGH (ref 0–99)
Triglycerides: 152 mg/dL — ABNORMAL HIGH (ref 0–149)
VLDL Cholesterol Cal: 27 mg/dL (ref 5–40)

## 2023-12-01 LAB — FERRITIN: Ferritin: 34 ng/mL (ref 15–150)

## 2023-12-01 LAB — CBC WITH DIFFERENTIAL/PLATELET
Basophils Absolute: 0 10*3/uL (ref 0.0–0.2)
Basos: 0 %
EOS (ABSOLUTE): 0.2 10*3/uL (ref 0.0–0.4)
Eos: 3 %
Hematocrit: 40.8 % (ref 34.0–46.6)
Hemoglobin: 13 g/dL (ref 11.1–15.9)
Immature Grans (Abs): 0 10*3/uL (ref 0.0–0.1)
Immature Granulocytes: 0 %
Lymphocytes Absolute: 2.7 10*3/uL (ref 0.7–3.1)
Lymphs: 37 %
MCH: 28 pg (ref 26.6–33.0)
MCHC: 31.9 g/dL (ref 31.5–35.7)
MCV: 88 fL (ref 79–97)
Monocytes Absolute: 0.5 10*3/uL (ref 0.1–0.9)
Monocytes: 7 %
Neutrophils Absolute: 3.9 10*3/uL (ref 1.4–7.0)
Neutrophils: 53 %
Platelets: 325 10*3/uL (ref 150–450)
RBC: 4.65 x10E6/uL (ref 3.77–5.28)
RDW: 15.4 % (ref 11.7–15.4)
WBC: 7.5 10*3/uL (ref 3.4–10.8)

## 2023-12-01 LAB — IRON: Iron: 146 ug/dL (ref 27–159)

## 2023-12-01 LAB — TSH: TSH: 2.39 u[IU]/mL (ref 0.450–4.500)

## 2023-12-01 NOTE — Progress Notes (Signed)
 Contacted via MyChart   Good morning Alexis Cordova, your labs have returned: - Kidney function, creatinine and eGFR, remains normal. Liver function is showing some elevations -- any alcohol or Tylenol  use at home? If so try to cut back on these. - Lipid panel continues to show elevations, but no medication needed at this time.  Continue healthy diet and regular exercise. - Iron  level is on higher side of normal. I would continue current supplement as you are taking. - Remainder of labs stable.  Any questions? Keep being stellar!!  Thank you for allowing me to participate in your care.  I appreciate you. Kindest regards, Husayn Reim

## 2023-12-10 LAB — CYTOLOGY - PAP
Adequacy: ABSENT
Comment: NEGATIVE
Diagnosis: NEGATIVE
High risk HPV: NEGATIVE

## 2023-12-10 NOTE — Progress Notes (Signed)
 Contacted via MyChart   Pap smear returned normal.  Great news!!  Repeat in 5 years:)

## 2024-01-19 ENCOUNTER — Other Ambulatory Visit: Payer: Self-pay | Admitting: Nurse Practitioner

## 2024-01-21 NOTE — Telephone Encounter (Signed)
 Requested Prescriptions  Pending Prescriptions Disp Refills   EPINEPHrine  0.3 mg/0.3 mL IJ SOAJ injection [Pharmacy Med Name: EPINEPHRINE  0.3 MG AUTO-INJECT] 1 each 0    Sig: INJECT 0.3 MG INTO THE MUSCLE AS NEEDED FOR ANAPHYLAXIS.     Immunology: Antidotes Passed - 01/21/2024  2:50 PM      Passed - Valid encounter within last 12 months    Recent Outpatient Visits           1 month ago Essential hypertension   [place] [facility] [place], Melanie DASEN, NP

## 2024-02-04 ENCOUNTER — Other Ambulatory Visit: Payer: Self-pay | Admitting: Nurse Practitioner

## 2024-02-05 NOTE — Telephone Encounter (Signed)
 Too soon for refill, refilled 11/30/23 for 90  and 4RF.  Requested Prescriptions  Pending Prescriptions Disp Refills   olmesartan  (BENICAR ) 5 MG tablet [Pharmacy Med Name: OLMESARTAN  MEDOXOMIL 5 MG TAB] 90 tablet 4    Sig: TAKE 1 TABLET (5 MG TOTAL) BY MOUTH DAILY.     Cardiovascular:  Angiotensin Receptor Blockers Passed - 02/05/2024 12:31 PM      Passed - Cr in normal range and within 180 days    Creatinine, Ser  Date Value Ref Range Status  11/30/2023 0.65 0.57 - 1.00 mg/dL Final         Passed - K in normal range and within 180 days    Potassium  Date Value Ref Range Status  11/30/2023 4.1 3.5 - 5.2 mmol/L Final         Passed - Patient is not pregnant      Passed - Last BP in normal range    BP Readings from Last 1 Encounters:  11/30/23 126/82         Passed - Valid encounter within last 6 months    Recent Outpatient Visits           2 months ago Essential hypertension   [facility] [place], Melanie DASEN, NP

## 2024-02-18 ENCOUNTER — Other Ambulatory Visit: Payer: Self-pay | Admitting: Nurse Practitioner

## 2024-02-18 DIAGNOSIS — Z1231 Encounter for screening mammogram for malignant neoplasm of breast: Secondary | ICD-10-CM

## 2024-03-08 ENCOUNTER — Ambulatory Visit
Admission: RE | Admit: 2024-03-08 | Discharge: 2024-03-08 | Disposition: A | Discharge status: Home / Self Care | Source: Ambulatory Visit | Attending: Nurse Practitioner | Admitting: Nurse Practitioner

## 2024-03-08 DIAGNOSIS — Z1231 Encounter for screening mammogram for malignant neoplasm of breast: Secondary | ICD-10-CM | POA: Insufficient documentation

## 2024-03-11 ENCOUNTER — Ambulatory Visit: Payer: Self-pay | Admitting: Nurse Practitioner

## 2024-03-11 NOTE — Progress Notes (Signed)
 Contacted via MyChart   Normal mammogram, may repeat in one year:)

## 2024-05-29 NOTE — Patient Instructions (Signed)
 Be Involved in Caring For Your Health:  Taking Medications When medications are taken as directed, they can greatly improve your health. But if they are not taken as prescribed, they may not work. In some cases, not taking them correctly can be harmful. To help ensure your treatment remains effective and safe, understand your medications and how to take them. Bring your medications to each visit for review by your provider.  Your lab results, notes, and after visit summary will be available on My Chart. We strongly encourage you to use this feature. If lab results are abnormal the clinic will contact you with the appropriate steps. If the clinic does not contact you assume the results are satisfactory. You can always view your results on My Chart. If you have questions regarding your health or results, please contact the clinic during office hours. You can also ask questions on My Chart.  We at [facility] are grateful that you chose us  to provide your care. We strive to provide evidence-based and compassionate care and are always looking for feedback. If you get a survey from the clinic please complete this so we can hear your opinions.  DASH Eating Plan DASH stands for Dietary Approaches to Stop Hypertension. The DASH eating plan is a healthy eating plan that has been shown to: Lower high blood pressure (hypertension). Reduce your risk for type 2 diabetes, heart disease, and stroke. Help with weight loss. What are tips for following this plan? Reading food labels Check food labels for the amount of salt (sodium) per serving. Choose foods with less than 5 percent of the Daily Value (DV) of sodium. In general, foods with less than 300 milligrams (mg) of sodium per serving fit into this eating plan. To find whole grains, look for the word whole as the first word in the ingredient list. Shopping Buy products labeled as low-sodium or no salt added. Buy fresh foods. Avoid canned  foods and pre-made or frozen meals. Cooking Try not to add salt when you cook. Use salt-free seasonings or herbs instead of table salt or sea salt. Check with your health care provider or pharmacist before using salt substitutes. Do not fry foods. Cook foods in healthy ways, such as baking, boiling, grilling, roasting, or broiling. Cook using oils that are good for your heart. These include olive, canola, avocado, soybean, and sunflower oil. Meal planning  Eat a balanced diet. This should include: 4 or more servings of fruits and 4 or more servings of vegetables each day. Try to fill half of your plate with fruits and vegetables. 6-8 servings of whole grains each day. 6 or less servings of lean meat, poultry, or fish each day. 1 oz is 1 serving. A 3 oz (85 g) serving of meat is about the same size as the palm of your hand. One egg is 1 oz (28 g). 2-3 servings of low-fat dairy each day. One serving is 1 cup (237 mL). 1 serving of nuts, seeds, or beans 5 times each week. 2-3 servings of heart-healthy fats. Healthy fats called omega-3 fatty acids are found in foods such as walnuts, flaxseeds, fortified milks, and eggs. These fats are also found in cold-water fish, such as sardines, salmon, and mackerel. Limit how much you eat of: Canned or prepackaged foods. Food that is high in trans fat, such as fried foods. Food that is high in saturated fat, such as fatty meat. Desserts and other sweets, sugary drinks, and other foods with added sugar. Full-fat  dairy products. Do not salt foods before eating. Do not eat more than 4 egg yolks a week. Try to eat at least 2 vegetarian meals a week. Eat more home-cooked food and less restaurant, buffet, and fast food. Lifestyle When eating at a restaurant, ask if your food can be made with less salt or no salt. If you drink alcohol: Limit how much you have to: 0-1 drink a day if you are female. 0-2 drinks a day if you are female. Know how much alcohol is in  your drink. In the [place] one drink is one 12 oz bottle of beer (355 mL), one 5 oz glass of wine (148 mL), or one 1 oz glass of hard liquor (44 mL). General information Avoid eating more than 2,300 mg of salt a day. If you have hypertension, you may need to reduce your sodium intake to 1,500 mg a day. Work with your provider to stay at a healthy body weight or lose weight. Ask what the best weight range is for you. On most days of the week, get at least 30 minutes of exercise that causes your heart to beat faster. This may include walking, swimming, or biking. Work with your provider or dietitian to adjust your eating plan to meet your specific calorie needs. What foods should I eat? Fruits All fresh, dried, or frozen fruit. Canned fruits that are in their natural juice and do not have sugar added to them. Vegetables Fresh or frozen vegetables that are raw, steamed, roasted, or grilled. Low-sodium or reduced-sodium tomato and vegetable juice. Low-sodium or reduced-sodium tomato sauce and tomato paste. Low-sodium or reduced-sodium canned vegetables. Grains Whole-grain or whole-wheat bread. Whole-grain or whole-wheat pasta. Brown rice. [place]. Bulgur. Whole-grain and low-sodium cereals. Pita bread. Low-fat, low-sodium crackers. Whole-wheat flour tortillas. Meats and other proteins Skinless chicken or [place]. Ground chicken or [place]. Pork with fat trimmed off. Fish and seafood. Egg whites. Dried beans, peas, or lentils. Unsalted nuts, nut butters, and seeds. Unsalted canned beans. Lean cuts of beef with fat trimmed off. Low-sodium, lean precooked or cured meat, such as sausages or meat loaves. Dairy Low-fat (1%) or fat-free (skim) milk. Reduced-fat, low-fat, or fat-free cheeses. Nonfat, low-sodium ricotta or cottage cheese. Low-fat or nonfat yogurt. Low-fat, low-sodium cheese. Fats and oils Soft margarine without trans fats. Vegetable oil. Reduced-fat, low-fat, or light mayonnaise and salad  dressings (reduced-sodium). Canola, safflower, olive, avocado, soybean, and sunflower oils. Avocado. Seasonings and condiments Herbs. Spices. Seasoning mixes without salt. Other foods Unsalted popcorn and pretzels. Fat-free sweets. The items listed above may not be all the foods and drinks you can have. Talk to a dietitian to learn more. What foods should I avoid? Fruits Canned fruit in a light or heavy syrup. Fried fruit. Fruit in cream or butter sauce. Vegetables Creamed or fried vegetables. Vegetables in a cheese sauce. Regular canned vegetables that are not marked as low-sodium or reduced-sodium. Regular canned tomato sauce and paste that are not marked as low-sodium or reduced-sodium. Regular tomato and vegetable juices that are not marked as low-sodium or reduced-sodium. Dene. Olives. Grains Baked goods made with fat, such as croissants, muffins, or some breads. Dry pasta or rice meal packs. Meats and other proteins Fatty cuts of meat. Ribs. Fried meat. [place]. Bologna, salami, and other precooked or cured meats, such as sausages or meat loaves, that are not lean and low in sodium. Fat from the back of a pig (fatback). Bratwurst. Salted nuts and seeds. Canned beans with added salt. Canned  or smoked fish. Whole eggs or egg yolks. Chicken or [place] with skin. Dairy Whole or 2% milk, cream, and half-and-half. Whole or full-fat cream cheese. Whole-fat or sweetened yogurt. Full-fat cheese. Nondairy creamers. Whipped toppings. Processed cheese and cheese spreads. Fats and oils Butter. Stick margarine. Lard. Shortening. Ghee. Bacon fat. Tropical oils, such as coconut, palm kernel, or palm oil. Seasonings and condiments Onion salt, garlic salt, seasoned salt, table salt, and sea salt. Worcestershire sauce. Tartar sauce. Barbecue sauce. Teriyaki sauce. Soy sauce, including reduced-sodium soy sauce. Steak sauce. Canned and packaged gravies. Fish sauce. Oyster sauce. Cocktail sauce. Store-bought  horseradish. Ketchup. Mustard. Meat flavorings and tenderizers. Bouillon cubes. Hot sauces. Pre-made or packaged marinades. Pre-made or packaged taco seasonings. Relishes. Regular salad dressings. Other foods Salted popcorn and pretzels. The items listed above may not be all the foods and drinks you should avoid. Talk to a dietitian to learn more. Where to find more information National Heart, Lung, and Blood Institute (NHLBI): BuffaloDryCleaner.gl American Heart Association (AHA): heart.org Academy of Nutrition and Dietetics: eatright.org National Kidney Foundation (NKF): kidney.org This information is not intended to replace advice given to you by your health care provider. Make sure you discuss any questions you have with your health care provider. Document Revised: 07/17/2022 Document Reviewed: 07/17/2022 Elsevier Patient Education  2024 [employer].

## 2024-06-03 ENCOUNTER — Encounter: Payer: Self-pay | Admitting: Nurse Practitioner

## 2024-06-03 ENCOUNTER — Ambulatory Visit (INDEPENDENT_AMBULATORY_CARE_PROVIDER_SITE_OTHER): Admitting: Nurse Practitioner

## 2024-06-03 VITALS — BP 135/78 | HR 93 | Temp 99.5°F | Ht 62.0 in | Wt 204.2 lb

## 2024-06-03 DIAGNOSIS — Z6838 Body mass index (BMI) 38.0-38.9, adult: Secondary | ICD-10-CM | POA: Diagnosis not present

## 2024-06-03 DIAGNOSIS — E7800 Pure hypercholesterolemia, unspecified: Secondary | ICD-10-CM

## 2024-06-03 DIAGNOSIS — F418 Other specified anxiety disorders: Secondary | ICD-10-CM | POA: Diagnosis not present

## 2024-06-03 DIAGNOSIS — I10 Essential (primary) hypertension: Secondary | ICD-10-CM

## 2024-06-03 DIAGNOSIS — E6609 Other obesity due to excess calories: Secondary | ICD-10-CM | POA: Diagnosis not present

## 2024-06-03 DIAGNOSIS — E66812 Obesity, class 2: Secondary | ICD-10-CM

## 2024-06-03 DIAGNOSIS — D500 Iron deficiency anemia secondary to blood loss (chronic): Secondary | ICD-10-CM | POA: Diagnosis not present

## 2024-06-03 MED ORDER — NAPROXEN 500 MG PO TABS: 500.0000 mg | ORAL_TABLET | Freq: Two times a day (BID) | ORAL | 2 refills | Status: AC | PRN

## 2024-06-03 NOTE — Assessment & Plan Note (Signed)
 Chronic, stable.  Has underlying fibroids and heavier cycles.  Check CBC, iron , ferritin at physical.  Continue supplement daily.  Consider hematology if any worsening in future.  Continue collaboration with GYN.

## 2024-06-03 NOTE — Assessment & Plan Note (Signed)
 Noted on past labs, recommend heavy focus on diet and regular exercise.  Check lipid panel today. She continues to lose weight.

## 2024-06-03 NOTE — Assessment & Plan Note (Signed)
 Chronic, stable.  BP at goal.  Continue current medication regimen and adjust as needed.  Recommend she monitor BP at least a few mornings a week at home and document.  DASH diet at home.  Labs today: CMP.  Educated her on medications not to take, nothing over the counter that has a D on it, ie. Claritin  D.  Can take plain OTC antihistamines or Coricidin.

## 2024-06-03 NOTE — Progress Notes (Signed)
 BP 135/78 (BP Location: Left Arm, Cuff Size: Normal)   Pulse 93   Temp 99.5 F (37.5 C) (Oral)   Ht 5' 2 (1.575 m)   Wt 204 lb 3.2 oz (92.6 kg)   LMP 05/01/2024 (Exact Date)   SpO2 99%   BMI 37.35 kg/m    Subjective:    Patient ID: Alexis Cordova, female    DOB: [birth year], 43 y.o.   MRN: [id number]  HPI: Alexis Cordova is a 43 y.o. female  Chief Complaint  Patient presents with   Hyperlipidemia   Hypertension   HYPERTENSION without Chronic Kidney Disease Taking Olmesartan  5 MG daily.  Has had some crazy months recently. Stressors. Hypertension status: stable  Satisfied with current treatment? yes Duration of hypertension: chronic BP monitoring frequency:  not checking BP range:  BP medication side effects:  no Medication compliance: good compliance Aspirin: no Recurrent headaches: no Visual changes: no Palpitations: no Dyspnea: occasionally going up steep stairs Chest pain: no Lower extremity edema: no Dizzy/lightheaded: no  The 10-year ASCVD risk score (Arnett DK, et al., 2019) is: 3.2%   Values used to calculate the score:     Age: 62 years     Clincally relevant sex: Female     Is Non-Hispanic African American: Yes     Diabetic: No     Tobacco smoker: No     Systolic Blood Pressure: 135 mmHg     Is BP treated: Yes     HDL Cholesterol: 47 mg/dL     Total Cholesterol: 209 mg/dL  ANEMIA Taking Slow Fe for anemia, suspect related to heavier cycles and fibroids - recent iron  levels have been stable with supplement on board.  Takes Vitamin D  for history of low levels, but does not take as much as she should. Anemia status: stable Etiology of anemia: heavier cycles Duration of anemia treatment: months Compliance with treatment: good compliance Iron  supplementation side effects: no Severity of anemia: moderate Fatigue: no Decreased exercise tolerance: no  Dyspnea on exertion: no Palpitations: no Bleeding: no Pica: no    BMI Metric Follow  Up - 06/03/24 0838       BMI Metric Follow Up-Please document annually   BMI Metric Follow Up Nutrition counseling              06/03/2024    8:14 AM 11/30/2023    1:16 PM 06/01/2023   10:11 AM 11/28/2022   11:22 AM 05/05/2022   10:33 AM  Depression screen PHQ 2/9  Decreased Interest 1 1 1 1 1   Down, Depressed, Hopeless 0 0 0 1 1  PHQ - 2 Score 1 1 1 2 2   Altered sleeping 0 1 1 1 1   Tired, decreased energy 1 1 0 0 1  Change in appetite 0 0 0 0 1  Feeling bad or failure about yourself  0 0 0 0 1  Trouble concentrating 0 0 0 0 0  Moving slowly or fidgety/restless 0 0 0 0 0  Suicidal thoughts 0 0 0 0 0  PHQ-9 Score 2 3  2  3  6    Difficult doing work/chores Not difficult at all Not difficult at all Not difficult at all Somewhat difficult Somewhat difficult     Data saved with a previous flowsheet row definition       06/03/2024    8:14 AM 11/30/2023    1:16 PM 06/01/2023   10:11 AM 11/28/2022   11:22 AM  GAD 7 : Generalized  Anxiety Score  Nervous, Anxious, on Edge 1 1 0 0  Control/stop worrying 1 1 0 0  Worry too much - different things 1 1 0 1  Trouble relaxing 1 0 0 1  Restless 0 0 0 0  Easily annoyed or irritable 1 0 0 1  Afraid - awful might happen 0 0 0 0  Total GAD 7 Score 5 3 0 3  Anxiety Difficulty Somewhat difficult Not difficult at all Not difficult at all Not difficult at all   Relevant past medical, surgical, family and social history reviewed and updated as indicated. Interim medical history since our last visit reviewed. Allergies and medications reviewed and updated.  Review of Systems  Constitutional:  Negative for activity change, appetite change, diaphoresis, fatigue and fever.  HENT:  Negative for ear discharge and ear pain.   Respiratory:  Negative for cough, chest tightness and shortness of breath.   Cardiovascular:  Negative for chest pain, palpitations and leg swelling.  Gastrointestinal: Negative.   Neurological: Negative.    Psychiatric/Behavioral: Negative.     Per HPI unless specifically indicated above     Objective:    BP 135/78 (BP Location: Left Arm, Cuff Size: Normal)   Pulse 93   Temp 99.5 F (37.5 C) (Oral)   Ht 5' 2 (1.575 m)   Wt 204 lb 3.2 oz (92.6 kg)   LMP 05/01/2024 (Exact Date)   SpO2 99%   BMI 37.35 kg/m   Wt Readings from Last 3 Encounters:  06/03/24 204 lb 3.2 oz (92.6 kg)  11/30/23 203 lb 12.8 oz (92.4 kg)  06/01/23 205 lb (93 kg)    Physical Exam Vitals and nursing note reviewed.  Constitutional:      General: She is awake. She is not in acute distress.    Appearance: She is well-developed and well-groomed. She is obese. She is not ill-appearing or toxic-appearing.  HENT:     Head: Normocephalic.     Right Ear: Hearing and external ear normal.     Left Ear: Hearing and external ear normal.  Eyes:     General: Lids are normal.        Right eye: No discharge.        Left eye: No discharge.     Conjunctiva/sclera: Conjunctivae normal.     Pupils: Pupils are equal, round, and reactive to light.  Neck:     Thyroid : No thyromegaly.     Vascular: No carotid bruit.  Cardiovascular:     Rate and Rhythm: Normal rate and regular rhythm.     Heart sounds: Normal heart sounds. No murmur heard.    No gallop.  Pulmonary:     Effort: Pulmonary effort is normal. No accessory muscle usage or respiratory distress.     Breath sounds: Normal breath sounds.  Abdominal:     General: Bowel sounds are normal. There is no distension.     Palpations: Abdomen is soft.     Tenderness: There is no abdominal tenderness.  Musculoskeletal:     Cervical back: Normal range of motion and neck supple.     Right lower leg: No edema.     Left lower leg: No edema.  Lymphadenopathy:     Cervical: No cervical adenopathy.  Skin:    General: Skin is warm and dry.  Neurological:     Mental Status: She is alert and oriented to person, place, and time.     Deep Tendon Reflexes: Reflexes are normal  and symmetric.  Reflex Scores:      Brachioradialis reflexes are 2+ on the right side and 2+ on the left side.      Patellar reflexes are 2+ on the right side and 2+ on the left side. Psychiatric:        Attention and Perception: Attention normal.        Mood and Affect: Mood normal.        Speech: Speech normal.        Behavior: Behavior normal. Behavior is cooperative.        Thought Content: Thought content normal.    Results for orders placed or performed in visit on 11/30/23  Cytology - PAP   Collection Time: 11/30/23  1:01 PM  Result Value Ref Range   High risk HPV Negative    Adequacy      Satisfactory for evaluation; transformation zone component ABSENT.   Diagnosis      - Negative for intraepithelial lesion or malignancy (NILM)   Comment Normal Reference Range HPV - Negative   Comprehensive metabolic panel with GFR   Collection Time: 11/30/23  1:40 PM  Result Value Ref Range   Glucose 96 70 - 99 mg/dL   BUN 9 6 - 24 mg/dL   Creatinine, Ser 9.34 0.57 - 1.00 mg/dL   eGFR 886 >40 fO/fpw/8.26   BUN/Creatinine Ratio 14 9 - 23   Sodium 136 134 - 144 mmol/L   Potassium 4.1 3.5 - 5.2 mmol/L   Chloride 98 96 - 106 mmol/L   CO2 20 20 - 29 mmol/L   Calcium  9.6 8.7 - 10.2 mg/dL   Total Protein 7.4 6.0 - 8.5 g/dL   Albumin 4.7 3.9 - 4.9 g/dL   Globulin, Total 2.7 1.5 - 4.5 g/dL   Bilirubin Total 0.3 0.0 - 1.2 mg/dL   Alkaline Phosphatase 58 44 - 121 IU/L   AST 61 (H) 0 - 40 IU/L   ALT 50 (H) 0 - 32 IU/L  CBC with Differential/Platelet   Collection Time: 11/30/23  1:40 PM  Result Value Ref Range   WBC 7.5 3.4 - 10.8 x10E3/uL   RBC 4.65 3.77 - 5.28 x10E6/uL   Hemoglobin 13.0 11.1 - 15.9 g/dL   Hematocrit 59.1 65.9 - 46.6 %   MCV 88 79 - 97 fL   MCH 28.0 26.6 - 33.0 pg   MCHC 31.9 31.5 - 35.7 g/dL   RDW 84.5 88.2 - 84.5 %   Platelets 325 150 - 450 x10E3/uL   Neutrophils 53 Not Estab. %   Lymphs 37 Not Estab. %   Monocytes 7 Not Estab. %   Eos 3 Not Estab. %    Basos 0 Not Estab. %   Neutrophils Absolute 3.9 1.4 - 7.0 x10E3/uL   Lymphocytes Absolute 2.7 0.7 - 3.1 x10E3/uL   Monocytes Absolute 0.5 0.1 - 0.9 x10E3/uL   EOS (ABSOLUTE) 0.2 0.0 - 0.4 x10E3/uL   Basophils Absolute 0.0 0.0 - 0.2 x10E3/uL   Immature Granulocytes 0 Not Estab. %   Immature Grans (Abs) 0.0 0.0 - 0.1 x10E3/uL  Lipid Panel w/o Chol/HDL Ratio   Collection Time: 11/30/23  1:40 PM  Result Value Ref Range   Cholesterol, Total 209 (H) 100 - 199 mg/dL   Triglycerides 847 (H) 0 - 149 mg/dL   HDL 47 >60 mg/dL   VLDL Cholesterol Cal 27 5 - 40 mg/dL   LDL Chol Calc (NIH) 864 (H) 0 - 99 mg/dL  TSH   Collection Time: 11/30/23  1:40 PM  Result Value Ref Range   TSH 2.390 0.450 - 4.500 uIU/mL  VITAMIN D  25 Hydroxy (Vit-D Deficiency, Fractures)   Collection Time: 11/30/23  1:40 PM  Result Value Ref Range   Vit D, 25-Hydroxy 36.5 30.0 - 100.0 ng/mL  Iron    Collection Time: 11/30/23  1:40 PM  Result Value Ref Range   Iron  146 27 - 159 ug/dL  Ferritin   Collection Time: 11/30/23  1:40 PM  Result Value Ref Range   Ferritin 34 15 - 150 ng/mL      Assessment & Plan:   Problem List Items Addressed This Visit       Cardiovascular and Mediastinum   Essential hypertension - Primary   Chronic, stable.  BP at goal.  Continue current medication regimen and adjust as needed.  Recommend she monitor BP at least a few mornings a week at home and document.  DASH diet at home.  Labs today: CMP.  Educated her on medications not to take, nothing over the counter that has a D on it, ie. Claritin  D.  Can take plain OTC antihistamines or Coricidin.      Relevant Orders   Comprehensive metabolic panel with GFR     Other   Situational anxiety   Due to work life, does not wish to start medication at this time.  Continue to work on daily meditation and relaxation exercises.      Obesity   BMI 37.35.  Recommended eating smaller high protein, low fat meals more frequently and exercising 30 mins  a day 5 times a week with a goal of 10-15lb weight loss in the next 3 months. Patient voiced their understanding and motivation to adhere to these recommendations.       Iron  deficiency anemia due to chronic blood loss   Chronic, stable.  Has underlying fibroids and heavier cycles.  Check CBC, iron , ferritin at physical.  Continue supplement daily.  Consider hematology if any worsening in future.  Continue collaboration with GYN.      Elevated low density lipoprotein (LDL) cholesterol level   Noted on past labs, recommend heavy focus on diet and regular exercise.  Check lipid panel today. She continues to lose weight.      Relevant Orders   Comprehensive metabolic panel with GFR   Lipid Panel w/o Chol/HDL Ratio     Follow up plan: Return in about 6 months (around 12/01/2024) for Annual Physical after 11/29/24.

## 2024-06-03 NOTE — Assessment & Plan Note (Signed)
 BMI 37.35.  Recommended eating smaller high protein, low fat meals more frequently and exercising 30 mins a day 5 times a week with a goal of 10-15lb weight loss in the next 3 months. Patient voiced their understanding and motivation to adhere to these recommendations.

## 2024-06-03 NOTE — Assessment & Plan Note (Signed)
Due to work life, does not wish to start medication at this time.  Continue to work on daily meditation and relaxation exercises.

## 2024-06-04 ENCOUNTER — Ambulatory Visit: Payer: Self-pay | Admitting: Nurse Practitioner

## 2024-06-04 DIAGNOSIS — R7989 Other specified abnormal findings of blood chemistry: Secondary | ICD-10-CM

## 2024-06-04 LAB — COMPREHENSIVE METABOLIC PANEL WITH GFR
ALT: 70 IU/L — ABNORMAL HIGH (ref 0–32)
AST: 123 IU/L — ABNORMAL HIGH (ref 0–40)
Albumin: 4.4 g/dL (ref 3.9–4.9)
Alkaline Phosphatase: 53 IU/L (ref 41–116)
BUN/Creatinine Ratio: 9 (ref 9–23)
BUN: 6 mg/dL (ref 6–24)
Bilirubin Total: 0.3 mg/dL (ref 0.0–1.2)
CO2: 22 mmol/L (ref 20–29)
Calcium: 9.5 mg/dL (ref 8.7–10.2)
Chloride: 101 mmol/L (ref 96–106)
Creatinine, Ser: 0.67 mg/dL (ref 0.57–1.00)
Globulin, Total: 2.9 g/dL (ref 1.5–4.5)
Glucose: 92 mg/dL (ref 70–99)
Potassium: 4.3 mmol/L (ref 3.5–5.2)
Sodium: 138 mmol/L (ref 134–144)
Total Protein: 7.3 g/dL (ref 6.0–8.5)
eGFR: 113 mL/min/1.73 (ref >59)

## 2024-06-04 LAB — LIPID PANEL W/O CHOL/HDL RATIO
Cholesterol, Total: 203 mg/dL — ABNORMAL HIGH (ref 100–199)
HDL: 51 mg/dL (ref >39)
LDL Chol Calc (NIH): 133 mg/dL — ABNORMAL HIGH (ref 0–99)
Triglycerides: 108 mg/dL (ref 0–149)
VLDL Cholesterol Cal: 19 mg/dL (ref 5–40)

## 2024-06-04 NOTE — Progress Notes (Signed)
 Contacted via MyChart The 10-year ASCVD risk score (Arnett DK, et al., 2019) is: 2.6%   Values used to calculate the score:     Age: 43 years     Clincally relevant sex: Female     Is Non-Hispanic African American: Yes     Diabetic: No     Tobacco smoker: No     Systolic Blood Pressure: 135 mmHg     Is BP treated: Yes     HDL Cholesterol: 51 mg/dL     Total Cholesterol: 203 mg/dL  Good evening Alexis Cordova, your labs have returned: - Kidney function, creatinine and eGFR, remains normal. Liver function is trending up more though. Do you take Tylenol  often or drink alcohol? Let me know. I would recommend getting an ultrasound of the liver since this continues to trend up. - Lipid panel continues to show elevations. No medication needed at this time though. Any questions? Keep being stellar!!  Thank you for allowing me to participate in your care.  I appreciate you. Kindest regards, Penne Rosenstock

## 2024-06-06 NOTE — Telephone Encounter (Signed)
 Will call back to schedule labs. Needs lab appt in  4 weeks

## 2024-12-02 ENCOUNTER — Encounter: Admitting: Nurse Practitioner
